# Patient Record
Sex: Male | Born: 1990 | Race: Black or African American | Hispanic: No | Marital: Single | Smoking: Current some day smoker
Health system: Southern US, Community
[De-identification: ages and names within clinical notes are randomized; demographics above are authoritative.]

## PROBLEM LIST (undated history)

## (undated) DIAGNOSIS — E663 Overweight: Secondary | ICD-10-CM

## (undated) DIAGNOSIS — I10 Essential (primary) hypertension: Secondary | ICD-10-CM

## (undated) HISTORY — DX: Overweight: E66.3

## (undated) HISTORY — DX: Essential (primary) hypertension: I10

---

## 2013-10-07 ENCOUNTER — Encounter (HOSPITAL_COMMUNITY): Payer: Self-pay | Admitting: Emergency Medicine

## 2013-10-07 ENCOUNTER — Emergency Department (HOSPITAL_COMMUNITY)
Admission: EM | Admit: 2013-10-07 | Discharge: 2013-10-07 | Disposition: A | Discharge status: Home / Self Care | Payer: BC Managed Care – PPO | Attending: Emergency Medicine | Admitting: Emergency Medicine

## 2013-10-07 DIAGNOSIS — Y92838 Other recreation area as the place of occurrence of the external cause: Secondary | ICD-10-CM

## 2013-10-07 DIAGNOSIS — T1490XA Injury, unspecified, initial encounter: Secondary | ICD-10-CM

## 2013-10-07 DIAGNOSIS — Y9239 Other specified sports and athletic area as the place of occurrence of the external cause: Secondary | ICD-10-CM | POA: Insufficient documentation

## 2013-10-07 DIAGNOSIS — W219XXA Striking against or struck by unspecified sports equipment, initial encounter: Secondary | ICD-10-CM | POA: Insufficient documentation

## 2013-10-07 DIAGNOSIS — F17200 Nicotine dependence, unspecified, uncomplicated: Secondary | ICD-10-CM | POA: Insufficient documentation

## 2013-10-07 DIAGNOSIS — S0180XA Unspecified open wound of other part of head, initial encounter: Secondary | ICD-10-CM | POA: Insufficient documentation

## 2013-10-07 DIAGNOSIS — Y9367 Activity, basketball: Secondary | ICD-10-CM | POA: Insufficient documentation

## 2013-10-07 DIAGNOSIS — S01111A Laceration without foreign body of right eyelid and periocular area, initial encounter: Secondary | ICD-10-CM

## 2013-10-07 DIAGNOSIS — S058X9A Other injuries of unspecified eye and orbit, initial encounter: Secondary | ICD-10-CM | POA: Insufficient documentation

## 2013-10-07 MED ORDER — LIDOCAINE-EPINEPHRINE-TETRACAINE (LET) SOLUTION
3.0000 mL | Freq: Once | NASAL | Status: AC
  Administered 2013-10-07: 3 mL via TOPICAL
  Filled 2013-10-07: qty 3

## 2013-10-07 NOTE — ED Provider Notes (Signed)
CSN: [id number]     Arrival date & time 10/07/13  1924 History   First MD Initiated Contact with Patient 10/07/13 2034     Chief Complaint  Patient presents with  . Facial Laceration     (Consider location/radiation/quality/duration/timing/severity/associated sxs/prior Treatment) HPI Pt is a 23yo male presenting to ED with laceration to his right eyelid that occurred due to being elbowed in the right eye during a basketball game just prior to arrival. Pain in right eyelid is burning, 2/10. Denies LOC. Denies change in vision. Denies headache, nausea or vomiting.  No pain medication taken PTA. Last tetanus was within last 5 years. Denies any other injuries.   History reviewed. No pertinent past medical history. History reviewed. No pertinent past surgical history. History reviewed. No pertinent family history. History  Substance Use Topics  . Smoking status: Current Every Day Smoker  . Smokeless tobacco: Not on file  . Alcohol Use: Yes    Review of Systems  Gastrointestinal: Negative for nausea and vomiting.  Skin: Positive for wound. Negative for color change.  Neurological: Negative for dizziness, seizures, syncope, light-headedness and headaches.  All other systems reviewed and are negative.     Allergies  Review of patient's allergies indicates no known allergies.  Home Medications   Prior to Admission medications   Not on File   BP 118/65  Pulse 70  Temp(Src) 98.2 F (36.8 C) (Oral)  Resp 16  SpO2 99% Physical Exam  Nursing note and vitals reviewed. Constitutional: He is oriented to person, place, and time. He appears well-developed and well-nourished.  HENT:  Head: Normocephalic. Head is with laceration.    Nose: Nose normal.  Mouth/Throat: Uvula is midline, oropharynx is clear and moist and mucous membranes are normal.  2cm laceration over lateral aspect of right eyelid. Minimal bleeding. Mild edema and tenderness. No bony tenderness or crepitus.   Eyes:  EOM are normal. Pupils are equal, round, and reactive to light.  Neck: Normal range of motion. Neck supple.  Cardiovascular: Normal rate.   Pulmonary/Chest: Effort normal.  Musculoskeletal: Normal range of motion.  Neurological: He is alert and oriented to person, place, and time. No cranial nerve deficit. Coordination normal.  Skin: Skin is warm and dry.  Psychiatric: He has a normal mood and affect. His behavior is normal.    ED Course  Procedures   LACERATION REPAIR Performed by: Junius Finner A. Authorized by: Ina Homes Consent: Verbal consent obtained. Risks and benefits: risks, benefits and alternatives were discussed Consent given by: patient Patient identity confirmed: provided demographic data Prepped and Draped in normal sterile fashion Wound explored  Laceration Location: right eyelid  Laceration Length: 2 cm  No Foreign Bodies seen or palpated  Anesthesia: topical and local infiltration  Local anesthetic: LET and lidocaine 1% with epinephrine  Anesthetic total: 0.25 ml  Irrigation method: syringe Amount of cleaning: standard  Skin closure: close, simple, 5-0 chromic gut  Number of sutures: 4  Technique: interrupted   Patient tolerance: Patient tolerated the procedure well with no immediate complications.   Labs Review Labs Reviewed - No data to display  Imaging Review No results found.   EKG Interpretation None      MDM   Final diagnoses:  Right eyelid laceration, initial encounter  Sports injury    Pt is a 23yo male presenting to ED with laceration to right eyelid.  Laceration repaired w/o immediate complication.  No LOC. No nausea vomiting or focal neuro deficits.  Will discharge  home [facility]-7 days. Return precautions provided. Pt verbalized understanding and agreement with tx plan.     Junius Finnerrin O'Malley, PA-C 10/07/13 2221

## 2013-10-07 NOTE — ED Provider Notes (Signed)
Medical screening examination/treatment/procedure(s) were performed by non-physician practitioner and as supervising physician I was immediately available for consultation/collaboration.   EKG Interpretation None        Rolan BuccoMelanie Emran Molzahn, MD 10/07/13 2306
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
## 2013-11-07 ENCOUNTER — Emergency Department (HOSPITAL_COMMUNITY): Payer: BC Managed Care – PPO

## 2013-11-07 ENCOUNTER — Encounter (HOSPITAL_COMMUNITY): Payer: Self-pay | Admitting: Emergency Medicine

## 2013-11-07 ENCOUNTER — Emergency Department (HOSPITAL_COMMUNITY)
Admission: EM | Admit: 2013-11-07 | Discharge: 2013-11-07 | Disposition: A | Discharge status: Home / Self Care | Payer: BC Managed Care – PPO | Attending: Emergency Medicine | Admitting: Emergency Medicine

## 2013-11-07 DIAGNOSIS — S01501A Unspecified open wound of lip, initial encounter: Secondary | ICD-10-CM | POA: Diagnosis present

## 2013-11-07 DIAGNOSIS — IMO0002 Reserved for concepts with insufficient information to code with codable children: Secondary | ICD-10-CM | POA: Insufficient documentation

## 2013-11-07 DIAGNOSIS — S01409A Unspecified open wound of unspecified cheek and temporomandibular area, initial encounter: Secondary | ICD-10-CM | POA: Insufficient documentation

## 2013-11-07 DIAGNOSIS — Y9289 Other specified places as the place of occurrence of the external cause: Secondary | ICD-10-CM | POA: Insufficient documentation

## 2013-11-07 DIAGNOSIS — S01502A Unspecified open wound of oral cavity, initial encounter: Secondary | ICD-10-CM | POA: Diagnosis not present

## 2013-11-07 DIAGNOSIS — Y9389 Activity, other specified: Secondary | ICD-10-CM | POA: Insufficient documentation

## 2013-11-07 DIAGNOSIS — F17200 Nicotine dependence, unspecified, uncomplicated: Secondary | ICD-10-CM | POA: Insufficient documentation

## 2013-11-07 DIAGNOSIS — S0181XA Laceration without foreign body of other part of head, initial encounter: Secondary | ICD-10-CM

## 2013-11-07 DIAGNOSIS — Z79899 Other long term (current) drug therapy: Secondary | ICD-10-CM | POA: Diagnosis not present

## 2013-11-07 DIAGNOSIS — S01512A Laceration without foreign body of oral cavity, initial encounter: Secondary | ICD-10-CM

## 2013-11-07 MED ORDER — LIDOCAINE-EPINEPHRINE 2 %-1:100000 IJ SOLN
20.0000 mL | Freq: Once | INTRAMUSCULAR | Status: AC
  Administered 2013-11-07: 1 mL
  Filled 2013-11-07: qty 1

## 2013-11-07 MED ORDER — HYDROCODONE-ACETAMINOPHEN 5-325 MG PO TABS
1.0000 | ORAL_TABLET | Freq: Once | ORAL | Status: AC
  Administered 2013-11-07: 1 via ORAL
  Filled 2013-11-07: qty 1

## 2013-11-07 MED ORDER — IBUPROFEN 200 MG PO TABS
600.0000 mg | ORAL_TABLET | Freq: Once | ORAL | Status: AC
  Administered 2013-11-07: 600 mg via ORAL
  Filled 2013-11-07: qty 3

## 2013-11-07 MED ORDER — CEPHALEXIN 250 MG PO CAPS
250.0000 mg | ORAL_CAPSULE | Freq: Once | ORAL | Status: AC
  Administered 2013-11-07: 250 mg via ORAL
  Filled 2013-11-07: qty 1

## 2013-11-07 MED ORDER — HYDROCODONE-ACETAMINOPHEN 5-325 MG PO TABS: 1.0000 | ORAL_TABLET | Freq: Four times a day (QID) | ORAL | Status: DC | PRN

## 2013-11-07 MED ORDER — CEPHALEXIN 250 MG PO CAPS: 250.0000 mg | ORAL_CAPSULE | Freq: Four times a day (QID) | ORAL | Status: DC

## 2013-11-07 NOTE — Discharge Instructions (Signed)
Absorbable Suture Repair °Absorbable sutures (stitches) hold skin together so you can heal. Keep skin wounds clean and dry for the next 2 to 3 days. Then, you may gently wash your wound and dress it with an antibiotic ointment as recommended. As your wound begins to heal, the sutures are no longer needed, and they typically begin to fall off. This will take 7 to 10 days. After 10 days, if your sutures are loose, you can remove them by wiping with a clean gauze pad or a cotton ball. Do not pull your sutures out. They should wipe away easily. If after 10 days they do not easily wipe away, have your caregiver take them out. Absorbable sutures may be used deep in a wound to help hold it together. If these stitches are below the skin, the body will absorb them completely in 3 to 4 weeks.  °You may need a tetanus shot if: °· You cannot remember when you had your last tetanus shot. °· You have never had a tetanus shot. °If you get a tetanus shot, your arm may swell, get red, and feel warm to the touch. This is common and not a problem. If you need a tetanus shot and you choose not to have one, there is a rare chance of getting tetanus. Sickness from tetanus can be serious. °SEEK IMMEDIATE MEDICAL CARE IF: °· You have redness in the wound area. °· The wound area feels hot to the touch. °· You develop swelling in the wound area. °· You develop pain. °· There is fluid drainage from the wound. °Document Released: 03/12/2004 Document Revised: 04/27/2011 Document Reviewed: 06/24/2010 °[employer]® Patient Information ©2015 [employer]. This information is not intended to replace advice given to you by your health care provider. Make sure you discuss any questions you have with your health care provider. ° °Facial Laceration ° A facial laceration is a cut on the face. These injuries can be painful and cause bleeding. Lacerations usually heal quickly, but they need special care to reduce scarring. °DIAGNOSIS  °Your health care  provider will take a medical history, ask for details about how the injury occurred, and examine the wound to determine how deep the cut is. °TREATMENT  °Some facial lacerations may not require closure. Others may not be able to be closed because of an increased risk of infection. The risk of infection and the chance for successful closure will depend on various factors, including the amount of time since the injury occurred. °The wound may be cleaned to help prevent infection. If closure is appropriate, pain medicines may be given if needed. Your health care provider will use stitches (sutures), wound glue (adhesive), or skin adhesive strips to repair the laceration. These tools bring the skin edges together to allow for faster healing and a better cosmetic outcome. If needed, you may also be given a tetanus shot. °HOME CARE INSTRUCTIONS °· Only take over-the-counter or prescription medicines as directed by your health care provider. °· Follow your health care provider's instructions for wound care. These instructions will vary depending on the technique used for closing the wound. °For Sutures: °· Keep the wound clean and dry.   °· If you were given a bandage (dressing), you should change it at least once a day. Also change the dressing if it becomes wet or dirty, or as directed by your health care provider.   °· Wash the wound with soap and water 2 times a day. Rinse the wound off with water to remove all   soap. Pat the wound dry with a clean towel.   After cleaning, apply a thin layer of the antibiotic ointment recommended by your health care provider. This will help prevent infection and keep the dressing from sticking.   You may shower as usual after the first 24 hours. Do not soak the wound in water until the sutures are removed.   Get your sutures removed as directed by your health care provider. With facial lacerations, sutures should usually be taken out after 4-5 days to avoid stitch marks.    Wait a few days after your sutures are removed before applying any makeup. For Skin Adhesive Strips:  Keep the wound clean and dry.   Do not get the skin adhesive strips wet. You may bathe carefully, using caution to keep the wound dry.   If the wound gets wet, pat it dry with a clean towel.   Skin adhesive strips will fall off on their own. You may trim the strips as the wound heals. Do not remove skin adhesive strips that are still stuck to the wound. They will fall off in time.  For Wound Adhesive:  You may briefly wet your wound in the shower or bath. Do not soak or scrub the wound. Do not swim. Avoid periods of heavy sweating until the skin adhesive has fallen off on its own. After showering or bathing, gently pat the wound dry with a clean towel.   Do not apply liquid medicine, cream medicine, ointment medicine, or makeup to your wound while the skin adhesive is in place. This may loosen the film before your wound is healed.   If a dressing is placed over the wound, be careful not to apply tape directly over the skin adhesive. This may cause the adhesive to be pulled off before the wound is healed.   Avoid prolonged exposure to sunlight or tanning lamps while the skin adhesive is in place.  The skin adhesive will usually remain in place for 5-10 days, then naturally fall off the skin. Do not pick at the adhesive film.  After Healing: Once the wound has healed, cover the wound with sunscreen during the day for 1 full year. This can help minimize scarring. Exposure to ultraviolet light in the first year will darken the scar. It can take 1-2 years for the scar to lose its redness and to heal completely.  SEEK IMMEDIATE MEDICAL CARE IF:  You have redness, pain, or swelling around the wound.   You see ayellowish-white fluid (pus) coming from the wound.   You have chills or a fever.  MAKE SURE YOU:  Understand these instructions.  Will watch your condition.  Will  get help right away if you are not doing well or get worse. Document Released: 03/12/2004 Document Revised: 11/23/2012 Document Reviewed: 09/15/2012 [facility] Patient Information 2015 [place], [place]. This information is not intended to replace advice given to you by your health care provider. Make sure you discuss any questions you have with your health care provider. The sutures in your mouth are absorbable and will NOT need removal but the 3 on your cheek will need to be removed in 5 days

## 2013-11-07 NOTE — ED Provider Notes (Signed)
Medical screening examination/treatment/procedure(s) were performed by non-physician practitioner and as supervising physician I was immediately available for consultation/collaboration.   EKG Interpretation None        Jahayra Mazo, MD 11/07/13 2313 
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
## 2013-11-07 NOTE — ED Notes (Signed)
Patient transported to X-ray 

## 2013-12-18 ENCOUNTER — Emergency Department (HOSPITAL_BASED_OUTPATIENT_CLINIC_OR_DEPARTMENT_OTHER)
Admission: EM | Admit: 2013-12-18 | Discharge: 2013-12-18 | Disposition: A | Discharge status: Home / Self Care | Payer: BC Managed Care – PPO | Attending: Emergency Medicine | Admitting: Emergency Medicine

## 2013-12-18 ENCOUNTER — Emergency Department (HOSPITAL_BASED_OUTPATIENT_CLINIC_OR_DEPARTMENT_OTHER): Payer: BC Managed Care – PPO

## 2013-12-18 ENCOUNTER — Encounter (HOSPITAL_BASED_OUTPATIENT_CLINIC_OR_DEPARTMENT_OTHER): Payer: Self-pay

## 2013-12-18 DIAGNOSIS — S01511A Laceration without foreign body of lip, initial encounter: Secondary | ICD-10-CM | POA: Diagnosis present

## 2013-12-18 DIAGNOSIS — S060X1A Concussion with loss of consciousness of 30 minutes or less, initial encounter: Secondary | ICD-10-CM | POA: Diagnosis not present

## 2013-12-18 DIAGNOSIS — Z720 Tobacco use: Secondary | ICD-10-CM | POA: Diagnosis not present

## 2013-12-18 MED ORDER — LIDOCAINE HCL (PF) 1 % IJ SOLN
5.0000 mL | Freq: Once | INTRAMUSCULAR | Status: DC
  Filled 2013-12-18: qty 5

## 2013-12-18 MED ORDER — AMOXICILLIN-POT CLAVULANATE 875-125 MG PO TABS: 1.0000 | ORAL_TABLET | Freq: Two times a day (BID) | ORAL | Status: DC

## 2013-12-18 MED ORDER — TRAMADOL HCL 50 MG PO TABS: 50.0000 mg | ORAL_TABLET | Freq: Four times a day (QID) | ORAL | Status: DC | PRN
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
## 2013-12-18 NOTE — Discharge Instructions (Signed)
Absorbable Suture Repair Absorbable sutures (stitches) hold skin together so you can heal. Keep skin wounds clean and dry for the next 2 to 3 days. Then, you may gently wash your wound and dress it with an antibiotic ointment as recommended. As your wound begins to heal, the sutures are no longer needed, and they typically begin to fall off. This will take 7 to 10 days. After 10 days, if your sutures are loose, you can remove them by wiping with a clean gauze pad or a cotton ball. Do not pull your sutures out. They should wipe away easily. If after 10 days they do not easily wipe away, have your caregiver take them out. Absorbable sutures may be used deep in a wound to help hold it together. If these stitches are below the skin, the body will absorb them completely in 3 to 4 weeks.  You may need a tetanus shot if:  You cannot remember when you had your last tetanus shot.  You have never had a tetanus shot. If you get a tetanus shot, your arm may swell, get red, and feel warm to the touch. This is common and not a problem. If you need a tetanus shot and you choose not to have one, there is a rare chance of getting tetanus. Sickness from tetanus can be serious. SEEK IMMEDIATE MEDICAL CARE IF:  You have redness in the wound area.  The wound area feels hot to the touch.  You develop swelling in the wound area.  You develop pain.  There is fluid drainage from the wound. Document Released: 03/12/2004 Document Revised: 04/27/2011 Document Reviewed: 06/24/2010 [facility] Patient Information 2015 [place], [place]. This information is not intended to replace advice given to you by your health care provider. Make sure you discuss any questions you have with your health care provider.  Concussion A concussion is a brain injury. It is caused by:  A hit to the head.  A quick and sudden movement (jolt) of the head or neck. A concussion is usually not life threatening. Even so, it can cause serious  problems. If you had a concussion before, you may have concussion-like problems after a hit to your head. HOME CARE General Instructions  Follow your doctor's directions carefully.  Take medicines only as told by your doctor.  Only take medicines your doctor says are safe.  Do not drink alcohol until your doctor says it is okay. Alcohol and some drugs can slow down healing. They can also put you at risk for further injury.  If you are having trouble remembering things, write them down.  Try to do one thing at a time if you get distracted easily. For example, do not watch TV while making dinner.  Talk to your family members or close friends when making important decisions.  Follow up with your doctor as told.  Watch your symptoms. Tell others to do the same. Serious problems can sometimes happen after a concussion. Older adults are more likely to have these problems.  Tell your teachers, school nurse, school counselor, coach, Event organiser, or work Production designer, theatre/television/film about your concussion. Tell them about what you can or cannot do. They should watch to see if:  It gets even harder for you to pay attention or concentrate.  It gets even harder for you to remember things or learn new things.  You need more time than normal to finish things.  You become annoyed (irritable) more than before.  You are not able to deal  with stress as well.  You have more problems than before.  Rest. Make sure you:  Get plenty of sleep at night.  Go to sleep early.  Go to bed at the same time every day. Try to wake up at the same time.  Rest during the day.  Take naps when you feel tired.  Limit activities where you have to think a lot or concentrate. These include:  Doing homework.  Doing work related to a job.  Watching TV.  Using the computer. Returning To Your Regular Activities Return to your normal activities slowly, not all at once. You must give your body and brain enough time to  heal.   Do not play sports or do other athletic activities until your doctor says it is okay.  Ask your doctor when you can drive, ride a bicycle, or work other vehicles or machines. Never do these things if you feel dizzy.  Ask your doctor about when you can return to work or school. Preventing Another Concussion It is very important to avoid another brain injury, especially before you have healed. In rare cases, another injury can lead to permanent brain damage, brain swelling, or death. The risk of this is greatest during the first 7-10 days after your injury. Avoid injuries by:   Wearing a seat belt when riding in a car.  Not drinking too much alcohol.  Avoiding activities that could lead to a second concussion (such as contact sports).  Wearing a helmet when doing activities like:  Biking.  Skiing.  Skateboarding.  Skating.  Making your home safer by:  Removing things from the floor or stairways that could make you trip.  Using grab bars in bathrooms and handrails by stairs.  Placing non-slip mats on floors and in bathtubs.  Improve lighting in dark areas. GET HELP IF:  It gets even harder for you to pay attention or concentrate.  It gets even harder for you to remember things or learn new things.  You need more time than normal to finish things.  You become annoyed (irritable) more than before.  You are not able to deal with stress as well.  You have more problems than before.  You have problems keeping your balance.  You are not able to react quickly when you should. Get help if you have any of these problems for more than 2 weeks:   Lasting (chronic) headaches.  Dizziness or trouble balancing.  Feeling sick to your stomach (nausea).  Seeing (vision) problems.  Being affected by noises or light more than normal.  Feeling sad, low, down in the dumps, blue, gloomy, or empty (depressed).  Mood changes (mood swings).  Feeling of fear or  nervousness about what may happen (anxiety).  Feeling annoyed.  Memory problems.  Problems concentrating or paying attention.  Sleep problems.  Feeling tired all the time. GET HELP RIGHT AWAY IF:   You have bad headaches or your headaches get worse.  You have weakness (even if it is in one hand, leg, or part of the face).  You have loss of feeling (numbness).  You feel off balance.  You keep throwing up (vomiting).  You feel tired.  One black center of your eye (pupil) is larger than the other.  You twitch or shake violently (convulse).  Your speech is not clear (slurred).  You are more confused, easily angered (agitated), or annoyed than before.  You have more trouble resting than before.  You are unable to recognize people  or places.  You have neck pain.  It is difficult to wake you up.  You have unusual behavior changes.  You pass out (lose consciousness). MAKE SURE YOU:   Understand these instructions.  Will watch your condition.  Will get help right away if you are not doing well or get worse. Document Released: 01/21/2009 Document Revised: 06/19/2013 Document Reviewed: 08/25/2012 [facility] Patient Information 2015 [place]. This information is not intended to replace advice given to you by your health care provider. Make sure you discuss any questions you have with your health care provider.  Facial Laceration A facial laceration is a cut on the face. These injuries can be painful and cause bleeding. Some cuts may need to be closed with stitches (sutures), skin adhesive strips, or wound glue. Cuts usually heal quickly but can leave a scar. It can take 1-2 years for the scar to go away completely. HOME CARE   Only take medicines as told by your doctor.  Follow your doctor's instructions for wound care. For Stitches:  Keep the cut clean and dry.  If you have a bandage (dressing), change it at least once a day. Change the bandage if it gets  wet or dirty, or as told by your doctor.  Wash the cut with soap and water 2 times a day. Rinse the cut with water. Pat it dry with a clean towel.  Put a thin layer of medicated cream on the cut as told by your doctor.  You may shower after the first 24 hours. Do not soak the cut in water until the stitches are removed.  Have your stitches removed as told by your doctor.  Do not wear any makeup until a few days after your stitches are removed. For Skin Adhesive Strips:  Keep the cut clean and dry.  Do not get the strips wet. You may take a bath, but be careful to keep the cut dry.  If the cut gets wet, pat it dry with a clean towel.  The strips will fall off on their own. Do not remove the strips that are still stuck to the cut. For Wound Glue:  You may shower or take baths. Do not soak or scrub the cut. Do not swim. Avoid heavy sweating until the glue falls off on its own. After a shower or bath, pat the cut dry with a clean towel.  Do not put medicine or makeup on your cut until the glue falls off.  If you have a bandage, do not put tape over the glue.  Avoid lots of sunlight or tanning lamps until the glue falls off.  The glue will fall off on its own in 5-10 days. Do not pick at the glue. After Healing: Put sunscreen on the cut for the first year to reduce your scar. GET HELP RIGHT AWAY IF:   Your cut area gets red, painful, or puffy (swollen).  You see a yellowish-white fluid (pus) coming from the cut.  You have chills or a fever. MAKE SURE YOU:   Understand these instructions.  Will watch your condition.  Will get help right away if you are not doing well or get worse. Document Released: 07/22/2007 Document Revised: 11/23/2012 Document Reviewed: 09/15/2012 [facility] Patient Information 2015 [place]. This information is not intended to replace advice given to you by your health care provider. Make sure you discuss any questions you have with your health  care provider.

## 2015-07-30 ENCOUNTER — Encounter (HOSPITAL_COMMUNITY): Payer: Self-pay

## 2015-07-30 ENCOUNTER — Emergency Department (HOSPITAL_COMMUNITY)
Admission: EM | Admit: 2015-07-30 | Discharge: 2015-07-30 | Disposition: A | Discharge status: Home / Self Care | Payer: Self-pay | Attending: Emergency Medicine | Admitting: Emergency Medicine

## 2015-07-30 DIAGNOSIS — K219 Gastro-esophageal reflux disease without esophagitis: Secondary | ICD-10-CM | POA: Insufficient documentation

## 2015-07-30 DIAGNOSIS — F17210 Nicotine dependence, cigarettes, uncomplicated: Secondary | ICD-10-CM | POA: Insufficient documentation

## 2015-07-30 MED ORDER — GI COCKTAIL ~~LOC~~
30.0000 mL | Freq: Once | ORAL | Status: AC
  Administered 2015-07-30: 30 mL via ORAL
  Filled 2015-07-30: qty 30

## 2015-07-30 MED ORDER — OMEPRAZOLE 20 MG PO CPDR: 20.0000 mg | DELAYED_RELEASE_CAPSULE | Freq: Two times a day (BID) | ORAL | Status: AC

## 2015-07-30 NOTE — Discharge Instructions (Signed)
Gastroesophageal Reflux Disease, Adult Normally, food travels down the esophagus and stays in the stomach to be digested. However, when a person has gastroesophageal reflux disease (GERD), food and stomach acid move back up into the esophagus. When this happens, the esophagus becomes sore and inflamed. Over time, GERD can create small holes (ulcers) in the lining of the esophagus.  CAUSES This condition is caused by a problem with the muscle between the esophagus and the stomach (lower esophageal sphincter, or LES). Normally, the LES muscle closes after food passes through the esophagus to the stomach. When the LES is weakened or abnormal, it does not close properly, and that allows food and stomach acid to go back up into the esophagus. The LES can be weakened by certain dietary substances, medicines, and medical conditions, including:  Tobacco use.  Pregnancy.  Having a hiatal hernia.  Heavy alcohol use.  Certain foods and beverages, such as coffee, chocolate, onions, and peppermint. RISK FACTORS This condition is more likely to develop in:  People who have an increased body weight.  People who have connective tissue disorders.  People who use NSAID medicines. SYMPTOMS Symptoms of this condition include:  Heartburn.  Difficult or painful swallowing.  The feeling of having a lump in the throat.  Abitter taste in the mouth.  Bad breath.  Having a large amount of saliva.  Having an upset or bloated stomach.  Belching.  Chest pain.  Shortness of breath or wheezing.  Ongoing (chronic) cough or a night-time cough.  Wearing away of tooth enamel.  Weight loss. Different conditions can cause chest pain. Make sure to see your health care provider if you experience chest pain. DIAGNOSIS Your health care provider will take a medical history and perform a physical exam. To determine if you have mild or severe GERD, your health care provider may also monitor how you respond  to treatment. You may also have other tests, including:  An endoscopy toexamine your stomach and esophagus with a small camera.  A test thatmeasures the acidity level in your esophagus.  A test thatmeasures how much pressure is on your esophagus.  A barium swallow or modified barium swallow to show the shape, size, and functioning of your esophagus. TREATMENT The goal of treatment is to help relieve your symptoms and to prevent complications. Treatment for this condition may vary depending on how severe your symptoms are. Your health care provider may recommend:  Changes to your diet.  Medicine.  Surgery. HOME CARE INSTRUCTIONS Diet  Follow a diet as recommended by your health care provider. This may involve avoiding foods and drinks such as:  Coffee and tea (with or without caffeine).  Drinks that containalcohol.  Energy drinks and sports drinks.  Carbonated drinks or sodas.  Chocolate and cocoa.  Peppermint and mint flavorings.  Garlic and onions.  Horseradish.  Spicy and acidic foods, including peppers, chili powder, curry powder, vinegar, hot sauces, and barbecue sauce.  Citrus fruit juices and citrus fruits, such as oranges, lemons, and limes.  Tomato-based foods, such as red sauce, chili, salsa, and pizza with red sauce.  Fried and fatty foods, such as donuts, french fries, potato chips, and high-fat dressings.  High-fat meats, such as hot dogs and fatty cuts of red and white meats, such as rib eye steak, sausage, ham, and bacon.  High-fat dairy items, such as whole milk, butter, and cream cheese.  Eat small, frequent meals instead of large meals.  Avoid drinking large amounts of liquid with your   meals.  Avoid eating meals during the 2-3 hours before bedtime.  Avoid lying down right after you eat.  Do not exercise right after you eat. General Instructions  Pay attention to any changes in your symptoms.  Take over-the-counter and prescription  medicines only as told by your health care provider. Do not take aspirin, ibuprofen, or other NSAIDs unless your health care provider told you to do so.  Do not use any tobacco products, including cigarettes, chewing tobacco, and e-cigarettes. If you need help quitting, ask your health care provider.  Wear loose-fitting clothing. Do not wear anything tight around your waist that causes pressure on your abdomen.  Raise (elevate) the head of your bed 6 inches (15cm).  Try to reduce your stress, such as with yoga or meditation. If you need help reducing stress, ask your health care provider.  If you are overweight, reduce your weight to an amount that is healthy for you. Ask your health care provider for guidance about a safe weight loss goal.  Keep all follow-up visits as told by your health care provider. This is important. SEEK MEDICAL CARE IF:  You have new symptoms.  You have unexplained weight loss.  You have difficulty swallowing, or it hurts to swallow.  You have wheezing or a persistent cough.  Your symptoms do not improve with treatment.  You have a hoarse voice. SEEK IMMEDIATE MEDICAL CARE IF:  You have pain in your arms, neck, jaw, teeth, or back.  You feel sweaty, dizzy, or light-headed.  You have chest pain or shortness of breath.  You vomit and your vomit looks like blood or coffee grounds.  You faint.  Your stool is bloody or black.  You cannot swallow, drink, or eat.   This information is not intended to replace advice given to you by your health care provider. Make sure you discuss any questions you have with your health care provider.   Document Released: 11/12/2004 Document Revised: 10/24/2014 Document Reviewed: 05/30/2014 Elsevier Interactive Patient Education 2016 [employer] Food Choices for Gastroesophageal Reflux Disease, Adult When you have gastroesophageal reflux disease (GERD), the foods you eat and your eating habits are very important.  Choosing the right foods can help ease the discomfort of GERD. WHAT GENERAL GUIDELINES DO I NEED TO FOLLOW?  Choose fruits, vegetables, whole grains, low-fat dairy products, and low-fat meat, fish, and poultry.  Limit fats such as oils, salad dressings, butter, nuts, and avocado.  Keep a food diary to identify foods that cause symptoms.  Avoid foods that cause reflux. These may be different for different people.  Eat frequent small meals instead of three large meals each day.  Eat your meals slowly, in a relaxed setting.  Limit fried foods.  Cook foods using methods other than frying.  Avoid drinking alcohol.  Avoid drinking large amounts of liquids with your meals.  Avoid bending over or lying down until 2-3 hours after eating. WHAT FOODS ARE NOT RECOMMENDED? The following are some foods and drinks that may worsen your symptoms: Vegetables Tomatoes. Tomato juice. Tomato and spaghetti sauce. Chili peppers. Onion and garlic. Horseradish. Fruits Oranges, grapefruit, and lemon (fruit and juice). Meats High-fat meats, fish, and poultry. This includes hot dogs, ribs, ham, sausage, salami, and bacon. Dairy Whole milk and chocolate milk. Sour cream. Cream. Butter. Ice cream. Cream cheese.  Beverages Coffee and tea, with or without caffeine. Carbonated beverages or energy drinks. Condiments Hot sauce. Barbecue sauce.  Sweets/Desserts Chocolate and cocoa. Donuts. Peppermint and spearmint.   Fats and Oils High-fat foods, including French fries and potato chips. Other Vinegar. Strong spices, such as black pepper, white pepper, red pepper, cayenne, curry powder, cloves, ginger, and chili powder. The items listed above may not be a complete list of foods and beverages to avoid. Contact your dietitian for more information.   This information is not intended to replace advice given to you by your health care provider. Make sure you discuss any questions you have with your health care  provider.   Document Released: 02/02/2005 Document Revised: 02/23/2014 Document Reviewed: 12/07/2012 Elsevier Interactive Patient Education 2016 [employer] Inc[employer]  

## 2015-07-30 NOTE — ED Provider Notes (Signed)
CSN: [id number]     Arrival date & time 07/30/15  1210 History   First MD Initiated Contact with Patient 07/30/15 1537     Chief Complaint  Patient presents with  . Throat swelling    HPI Pt has had a sensation that something is stuck in his throat the past week however he has not had any trouble swallowing or eating.  no trouble breathing.  He has an acid feeling in his stomach.  Feels like he needs to burp.  No vomiting.  No fevers.  No pain.  Still able to eat and drink.  Did notice it got worse after drinking lemon aide.  Has not tried any medications. History reviewed. No pertinent past medical history. History reviewed. No pertinent past surgical history. No family history on file. Social History  Substance Use Topics  . Smoking status: Current Every Day Smoker    Types: Cigars  . Smokeless tobacco: None  . Alcohol Use: Yes     Comment: socially     Review of Systems  All other systems reviewed and are negative.     Allergies  Review of patient's allergies indicates no known allergies.  Home Medications   Prior to Admission medications   Medication Sig Start Date End Date Taking? Authorizing Provider  amoxicillin-clavulanate (AUGMENTIN) 875-125 MG per tablet Take 1 tablet by mouth every 12 (twelve) hours. 12/18/13   Gilda Crease, MD  omeprazole (PRILOSEC) 20 MG capsule Take 1 capsule (20 mg total) by mouth 2 (two) times daily before a meal. 07/30/15   Linwood Dibbles, MD  traMADol (ULTRAM) 50 MG tablet Take 1 tablet (50 mg total) by mouth every 6 (six) hours as needed. 12/18/13   Gilda Crease, MD   BP 149/80 mmHg  Pulse 46  Temp(Src) 98.6 F (37 C) (Oral)  Resp 18  Ht  (1.702 m)  Wt 83.915 kg  BMI 28.97 kg/m2  SpO2 100% Physical Exam  Constitutional: He appears well-developed and well-nourished. No distress.  HENT:  Head: Normocephalic and atraumatic.  Right Ear: External ear normal.  Left Ear: External ear normal.  Mouth/Throat: Oropharynx is  clear and moist and mucous membranes are normal. No oral lesions. No oropharyngeal exudate, posterior oropharyngeal edema, posterior oropharyngeal erythema or tonsillar abscesses.  Eyes: Conjunctivae are normal. Right eye exhibits no discharge. Left eye exhibits no discharge. No scleral icterus.  Neck: Neck supple. No tracheal deviation present.  Cardiovascular: Normal rate, regular rhythm and intact distal pulses.   Pulmonary/Chest: Effort normal and breath sounds normal. No stridor. No respiratory distress. He has no wheezes. He has no rales.  Abdominal: Soft. Bowel sounds are normal. He exhibits no distension. There is no tenderness. There is no rebound and no guarding.  Musculoskeletal: He exhibits no edema or tenderness.  Neurological: He is alert. He has normal strength. No cranial nerve deficit (no facial droop, extraocular movements intact, no slurred speech) or sensory deficit. He exhibits normal muscle tone. He displays no seizure activity. Coordination normal.  Skin: Skin is warm and dry. No rash noted.  Psychiatric: He has a normal mood and affect.  Nursing note and vitals reviewed.   ED Course  Procedures (including critical care time)   MDM   Final diagnoses:  Gastroesophageal reflux disease, esophagitis presence not specified    Sx suggestive of GERD, esophagitis.  Appears well hydrated.  Tolerating fluids and food.  No obstruction.  Doubt mass. Will dc home with rx for prilosec.  Supplement with  maalox or tums.  Avoid caffeine, alcohol, smoking.  Follow up with a PCP in 1 week.   Discussed gi referral if not getting better after treatment.    Linwood DibblesJon Shawnee Gambone, MD 07/30/15 [phone]

## 2015-07-30 NOTE — ED Notes (Addendum)
Patient complains of feeling like something stuck in throat the past week. States ongoing heartburn with same. No soreness to throat. No redness, no swelling to throat

## 2015-12-26 ENCOUNTER — Encounter (HOSPITAL_COMMUNITY): Payer: Self-pay | Admitting: Emergency Medicine

## 2015-12-26 ENCOUNTER — Emergency Department (HOSPITAL_COMMUNITY)
Admission: EM | Admit: 2015-12-26 | Discharge: 2015-12-27 | Disposition: A | Discharge status: Home / Self Care | Payer: No Typology Code available for payment source | Attending: Emergency Medicine | Admitting: Emergency Medicine

## 2015-12-26 DIAGNOSIS — F17290 Nicotine dependence, other tobacco product, uncomplicated: Secondary | ICD-10-CM | POA: Diagnosis not present

## 2015-12-26 DIAGNOSIS — Y999 Unspecified external cause status: Secondary | ICD-10-CM | POA: Insufficient documentation

## 2015-12-26 DIAGNOSIS — Y92410 Unspecified street and highway as the place of occurrence of the external cause: Secondary | ICD-10-CM | POA: Diagnosis not present

## 2015-12-26 DIAGNOSIS — S50811A Abrasion of right forearm, initial encounter: Secondary | ICD-10-CM | POA: Insufficient documentation

## 2015-12-26 DIAGNOSIS — S59911A Unspecified injury of right forearm, initial encounter: Secondary | ICD-10-CM | POA: Diagnosis present

## 2015-12-26 DIAGNOSIS — Z79899 Other long term (current) drug therapy: Secondary | ICD-10-CM | POA: Insufficient documentation

## 2015-12-26 DIAGNOSIS — Y9389 Activity, other specified: Secondary | ICD-10-CM | POA: Diagnosis not present
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
## 2015-12-27 MED ORDER — METHOCARBAMOL 500 MG PO TABS: 500.0000 mg | ORAL_TABLET | Freq: Two times a day (BID) | ORAL | 0 refills | Status: AC

## 2015-12-27 MED ORDER — IBUPROFEN 800 MG PO TABS: 800.0000 mg | ORAL_TABLET | Freq: Three times a day (TID) | ORAL | 0 refills | Status: DC
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
## 2015-12-27 NOTE — ED Notes (Signed)
Patient d/c'd self care.  F/U and medications reviewed.  Patient verbalized understanding. 

## 2015-12-27 NOTE — Discharge Instructions (Signed)
You likely will be sore over the next 2-3 days. Please take the Robaxin (muscle relaxer) twice daily, and ibuprofen every six hours for relief of your pain and stiffness. Robaxin can make you drowsy, so take before going to sleep, and do not drive while taking it. Also, please come back to the ED for any concerning neurologic symptoms or symptoms of concussion as we discussed.

## 2015-12-29 ENCOUNTER — Emergency Department (HOSPITAL_COMMUNITY): Payer: No Typology Code available for payment source

## 2015-12-29 ENCOUNTER — Encounter (HOSPITAL_COMMUNITY): Payer: Self-pay | Admitting: Emergency Medicine

## 2015-12-29 ENCOUNTER — Emergency Department (HOSPITAL_COMMUNITY)
Admission: EM | Admit: 2015-12-29 | Discharge: 2015-12-29 | Disposition: A | Discharge status: Home / Self Care | Payer: No Typology Code available for payment source | Attending: Emergency Medicine | Admitting: Emergency Medicine

## 2015-12-29 ENCOUNTER — Emergency Department (HOSPITAL_COMMUNITY)
Admission: EM | Admit: 2015-12-29 | Discharge: 2015-12-29 | Disposition: A | Discharge status: Home / Self Care | Payer: No Typology Code available for payment source | Source: Home / Self Care | Attending: Emergency Medicine | Admitting: Emergency Medicine

## 2015-12-29 DIAGNOSIS — Y92410 Unspecified street and highway as the place of occurrence of the external cause: Secondary | ICD-10-CM | POA: Insufficient documentation

## 2015-12-29 DIAGNOSIS — F17290 Nicotine dependence, other tobacco product, uncomplicated: Secondary | ICD-10-CM

## 2015-12-29 DIAGNOSIS — Y999 Unspecified external cause status: Secondary | ICD-10-CM | POA: Insufficient documentation

## 2015-12-29 DIAGNOSIS — S39012A Strain of muscle, fascia and tendon of lower back, initial encounter: Secondary | ICD-10-CM

## 2015-12-29 DIAGNOSIS — Y939 Activity, unspecified: Secondary | ICD-10-CM

## 2015-12-29 DIAGNOSIS — S3992XA Unspecified injury of lower back, initial encounter: Secondary | ICD-10-CM | POA: Diagnosis not present

## 2015-12-29 DIAGNOSIS — M542 Cervicalgia: Secondary | ICD-10-CM | POA: Insufficient documentation

## 2015-12-29 MED ORDER — HYDROCODONE-ACETAMINOPHEN 5-325 MG PO TABS: 1.0000 | ORAL_TABLET | ORAL | 0 refills | Status: DC | PRN

## 2015-12-29 MED ORDER — OXYCODONE-ACETAMINOPHEN 5-325 MG PO TABS
1.0000 | ORAL_TABLET | Freq: Once | ORAL | Status: AC
  Administered 2015-12-29: 1 via ORAL
  Filled 2015-12-29: qty 1
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
## 2015-12-29 NOTE — ED Notes (Signed)
Patient transported to X-ray 

## 2015-12-29 NOTE — Discharge Instructions (Signed)
Take the prescribed medication as directed.  Do not drive while taking this.  May continue taking the other medications with this as well. Follow-up with your primary care doctor. Return to the ED for new or worsening symptoms.
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
## 2015-12-29 NOTE — ED Triage Notes (Signed)
Pt states [facility] and requested a head CT.  States he was told to return if he continued to have head pain.  Pt states head pain and back pain continued and [facility] and [facility] head CT and received pain medication.  Pt states he requested a work note until Wednesday and was told that he didn't need one for that long but [facility].  States on Friday they gave him a work note through Monday.

## 2019-10-17 ENCOUNTER — Ambulatory Visit
Admission: EM | Admit: 2019-10-17 | Discharge: 2019-10-17 | Disposition: A | Discharge status: Home / Self Care | Payer: Self-pay | Attending: Emergency Medicine | Admitting: Emergency Medicine

## 2019-10-17 ENCOUNTER — Other Ambulatory Visit: Payer: Self-pay

## 2019-10-17 ENCOUNTER — Encounter: Payer: Self-pay | Admitting: Emergency Medicine

## 2019-10-17 DIAGNOSIS — Z1152 Encounter for screening for COVID-19: Secondary | ICD-10-CM

## 2019-10-17 DIAGNOSIS — J069 Acute upper respiratory infection, unspecified: Secondary | ICD-10-CM

## 2019-10-17 MED ORDER — BENZONATATE 200 MG PO CAPS: 200.0000 mg | ORAL_CAPSULE | Freq: Three times a day (TID) | ORAL | 0 refills | Status: AC | PRN

## 2019-10-17 MED ORDER — IBUPROFEN 800 MG PO TABS: 800.0000 mg | ORAL_TABLET | Freq: Three times a day (TID) | ORAL | 0 refills | Status: AC

## 2019-10-17 MED ORDER — FLUTICASONE PROPIONATE 50 MCG/ACT NA SUSP: 1.0000 | Freq: Every day | NASAL | 2 refills | Status: AC

## 2019-10-17 NOTE — Discharge Instructions (Signed)
Covid test pending, monitor my chart for results Ibuprofen and Tylenol for body aches, headaches and fevers Tessalon/benzonatate every 8 hours as needed for cough Flonase nasal spray 1 to 2 spray in each nostril daily Rest and fluids Follow-up if not improving or worsening

## 2019-10-17 NOTE — ED Provider Notes (Signed)
[facility]    CSN: [id number] Arrival date & time: 10/17/19  1112      History   Chief Complaint Chief Complaint  Patient presents with  . Nasal Congestion  . Cough    HPI Jesus Wong is a 29 y.o. male presenting today for evaluation of URI symptoms.  Patient has had cough congestion and sore throat along with fatigue and slight diarrhea for approximately 3 days.  He denies any fevers.  Denies any chest pain or shortness of breath.  Reports possible Covid exposure through his other his son as he was exposed at school.  His son has had cold symptoms, but has had testing which was negative.  HPI  History reviewed. No pertinent past medical history.  There are no problems to display for this patient.   History reviewed. No pertinent surgical history.     Home Medications    Prior to Admission medications   Medication Sig Start Date End Date Taking? Authorizing Provider  benzonatate (TESSALON) 200 MG capsule Take 1 capsule (200 mg total) by mouth 3 (three) times daily as needed for up to 7 days for cough. 10/17/19 10/24/19  Moreen Piggott C, PA-C  fluticasone (FLONASE) 50 MCG/ACT nasal spray Place 1-2 sprays into both nostrils daily. 10/17/19   Nneoma Harral C, PA-C  ibuprofen (ADVIL) 800 MG tablet Take 1 tablet (800 mg total) by mouth 3 (three) times daily. 10/17/19   Venicia Vandall C, PA-C  methocarbamol (ROBAXIN) 500 MG tablet Take 1 tablet (500 mg total) by mouth 2 (two) times daily. 12/27/15   de Villier, Daryl F II, PA  omeprazole (PRILOSEC) 20 MG capsule Take 1 capsule (20 mg total) by mouth 2 (two) times daily before a meal. 07/30/15   Linwood Dibbles, MD    Family History History reviewed. No pertinent family history.  Social History Social History   Tobacco Use  . Smoking status: Current Some Day Smoker    Types: Cigars  . Smokeless tobacco: Never Used  Substance Use Topics  . Alcohol use: Yes    Comment: socially   . Drug use: No      Allergies   Patient has no known allergies.   Review of Systems Review of Systems  Constitutional: Positive for fatigue. Negative for activity change, appetite change, chills and fever.  HENT: Positive for congestion and sore throat. Negative for ear pain, rhinorrhea, sinus pressure and trouble swallowing.   Eyes: Negative for discharge and redness.  Respiratory: Positive for cough. Negative for chest tightness and shortness of breath.   Cardiovascular: Negative for chest pain.  Gastrointestinal: Negative for abdominal pain, diarrhea, nausea and vomiting.  Musculoskeletal: Negative for myalgias.  Skin: Negative for rash.  Neurological: Negative for dizziness, light-headedness and headaches.     Physical Exam Triage Vital Signs ED Triage Vitals  Enc Vitals Group     BP      Pulse      Resp      Temp      Temp src      SpO2      Weight      Height      Head Circumference      Peak Flow      Pain Score      Pain Loc      Pain Edu?      Excl. in GC?    No data found.  Updated Vital Signs BP 105/66 (BP Location: Left Arm)   Pulse 69  Temp 99 F (37.2 C) (Oral)   Resp 18   SpO2 98%   Visual Acuity Right Eye Distance:   Left Eye Distance:   Bilateral Distance:    Right Eye Near:   Left Eye Near:    Bilateral Near:     Physical Exam Vitals and nursing note reviewed.  Constitutional:      Appearance: He is well-developed.     Comments: No acute distress  HENT:     Head: Normocephalic and atraumatic.     Ears:     Comments: Bilateral ears without tenderness to palpation of external auricle, tragus and mastoid, EAC's without erythema or swelling, TM's with good bony landmarks and cone of light. Non erythematous.     Nose: Nose normal.     Mouth/Throat:     Comments: Oral mucosa pink and moist, no tonsillar enlargement or exudate. Posterior pharynx patent and nonerythematous, no uvula deviation or swelling. Normal phonation. Eyes:      Conjunctiva/sclera: Conjunctivae normal.  Cardiovascular:     Rate and Rhythm: Normal rate.  Pulmonary:     Effort: Pulmonary effort is normal. No respiratory distress.     Comments: Breathing comfortably at rest, CTABL, no wheezing, rales or other adventitious sounds auscultated Abdominal:     General: There is no distension.  Musculoskeletal:        General: Normal range of motion.     Cervical back: Neck supple.  Skin:    General: Skin is warm and dry.  Neurological:     Mental Status: He is alert and oriented to person, place, and time.      UC Treatments / Results  Labs (all labs ordered are listed, but only abnormal results are displayed) Labs Reviewed  NOVEL CORONAVIRUS, NAA    EKG   Radiology No results found.  Procedures Procedures (including critical care time)  Medications Ordered in UC Medications - No data to display  Initial Impression / Assessment and Plan / UC Course  I have reviewed the triage vital signs and the nursing notes.  Pertinent labs & imaging results that were available during my care of the patient were reviewed by me and considered in my medical decision making (see chart for details).     Covid test pending, suspect likely viral etiology and recommend symptomatic and supportive care, rest and fluids. Discussed strict return precautions. Patient verbalized understanding and is agreeable with plan.  Final Clinical Impressions(s) / UC Diagnoses   Final diagnoses:  Encounter for screening for COVID-19  Viral URI with cough     Discharge Instructions     Covid test pending, monitor my chart for results Ibuprofen and Tylenol for body aches, headaches and fevers Tessalon/benzonatate every 8 hours as needed for cough Flonase nasal spray 1 to 2 spray in each nostril daily Rest and fluids Follow-up if not improving or worsening    ED Prescriptions    Medication Sig Dispense Auth. Provider   fluticasone (FLONASE) 50 MCG/ACT  nasal spray Place 1-2 sprays into both nostrils daily. 16 g Satin Boal C, PA-C   ibuprofen (ADVIL) 800 MG tablet Take 1 tablet (800 mg total) by mouth 3 (three) times daily. 21 tablet Jocelynne Duquette C, PA-C   benzonatate (TESSALON) 200 MG capsule Take 1 capsule (200 mg total) by mouth 3 (three) times daily as needed for up to 7 days for cough. 28 capsule Annasofia Pohl, [place] C, PA-C     PDMP not reviewed this encounter.   Naomie Crow, [employer]  C, PA-C 10/17/19 1228

## 2019-10-17 NOTE — ED Triage Notes (Signed)
Pt here for cough and nasal congestion x 3 days 

## 2019-10-18 LAB — NOVEL CORONAVIRUS, NAA: SARS-CoV-2, NAA: NOT DETECTED

## 2020-01-17 DIAGNOSIS — U071 COVID-19: Secondary | ICD-10-CM

## 2020-01-17 HISTORY — DX: COVID-19: U07.1

## 2020-01-28 ENCOUNTER — Emergency Department (HOSPITAL_COMMUNITY)
Admission: EM | Admit: 2020-01-28 | Discharge: 2020-01-29 | Disposition: A | Discharge status: Home / Self Care | Payer: HRSA Program | Attending: Emergency Medicine | Admitting: Emergency Medicine

## 2020-01-28 ENCOUNTER — Emergency Department (HOSPITAL_COMMUNITY): Payer: HRSA Program

## 2020-01-28 ENCOUNTER — Encounter (HOSPITAL_COMMUNITY): Payer: Self-pay | Admitting: *Deleted

## 2020-01-28 ENCOUNTER — Other Ambulatory Visit: Payer: Self-pay

## 2020-01-28 DIAGNOSIS — R42 Dizziness and giddiness: Secondary | ICD-10-CM | POA: Diagnosis not present

## 2020-01-28 DIAGNOSIS — U071 COVID-19: Secondary | ICD-10-CM | POA: Diagnosis not present

## 2020-01-28 DIAGNOSIS — R519 Headache, unspecified: Secondary | ICD-10-CM | POA: Diagnosis present

## 2020-01-28 DIAGNOSIS — I10 Essential (primary) hypertension: Secondary | ICD-10-CM | POA: Diagnosis not present

## 2020-01-28 DIAGNOSIS — F17290 Nicotine dependence, other tobacco product, uncomplicated: Secondary | ICD-10-CM | POA: Diagnosis not present

## 2020-01-28 LAB — CBC WITH DIFFERENTIAL/PLATELET
Abs Immature Granulocytes: 0 10*3/uL (ref 0.00–0.07)
Basophils Absolute: 0 10*3/uL (ref 0.0–0.1)
Basophils Relative: 0 %
Eosinophils Absolute: 0 10*3/uL (ref 0.0–0.5)
Eosinophils Relative: 0 %
HCT: 41.6 % (ref 39.0–52.0)
Hemoglobin: 14.5 g/dL (ref 13.0–17.0)
Immature Granulocytes: 0 %
Lymphocytes Relative: 30 %
Lymphs Abs: 1 10*3/uL (ref 0.7–4.0)
MCH: 30.1 pg (ref 26.0–34.0)
MCHC: 34.9 g/dL (ref 30.0–36.0)
MCV: 86.5 fL (ref 80.0–100.0)
Monocytes Absolute: 0.4 10*3/uL (ref 0.1–1.0)
Monocytes Relative: 12 %
Neutro Abs: 2 10*3/uL (ref 1.7–7.7)
Neutrophils Relative %: 58 %
Platelets: 168 10*3/uL (ref 150–400)
RBC: 4.81 MIL/uL (ref 4.22–5.81)
RDW: 11.7 % (ref 11.5–15.5)
WBC: 3.4 10*3/uL — ABNORMAL LOW (ref 4.0–10.5)
nRBC: 0 % (ref 0.0–0.2)

## 2020-01-28 LAB — COMPREHENSIVE METABOLIC PANEL
ALT: 20 U/L (ref 0–44)
AST: 26 U/L (ref 15–41)
Albumin: 4.1 g/dL (ref 3.5–5.0)
Alkaline Phosphatase: 36 U/L — ABNORMAL LOW (ref 38–126)
Anion gap: 11 (ref 5–15)
BUN: 11 mg/dL (ref 6–20)
CO2: 26 mmol/L (ref 22–32)
Calcium: 9.1 mg/dL (ref 8.9–10.3)
Chloride: 101 mmol/L (ref 98–111)
Creatinine, Ser: 1.01 mg/dL (ref 0.61–1.24)
GFR, Estimated: >60 mL/min (ref >60)
Glucose, Bld: 84 mg/dL (ref 70–99)
Potassium: 3.9 mmol/L (ref 3.5–5.1)
Sodium: 138 mmol/L (ref 135–145)
Total Bilirubin: 0.6 mg/dL (ref 0.3–1.2)
Total Protein: 7.2 g/dL (ref 6.5–8.1)

## 2020-01-28 MED ORDER — LACTATED RINGERS IV BOLUS
1000.0000 mL | Freq: Once | INTRAVENOUS | Status: AC
  Administered 2020-01-28: 22:00:00 1000 mL via INTRAVENOUS

## 2020-01-28 NOTE — ED Triage Notes (Signed)
The pt is here becaUSE HE TOOK 2 COVID TESTS AT HOME AND THEY WERE positive  He has had numerus complaints since Thursday headache weakness  He fell and struck his head 2 days ago from weakness ?? No temp alert no distress  He has had body aches

## 2020-01-28 NOTE — ED Notes (Signed)
Pt to CT

## 2020-01-28 NOTE — ED Provider Notes (Signed)
[facility] EMERGENCY DEPARTMENT Provider Note   CSN: [id number] Arrival date & time: 01/28/20  1647     History Chief Complaint  Patient presents with  . Covid Positive    Jesus Wong is a 29 y.o. male with a past medical history of hypertension who presents today for evaluation of 3 days of COVID-19 symptoms.  He took 2 Covid tests at home and they were both positive.  He states that he has had multiple complaints including myalgias, arthralgias, headaches, and a syncopal event.  He states that yesterday he sat in the tub for an extended period of time to try and help with his knee pain.  He then stood up to take a hot shower and about 5 minutes after that felt lightheaded and had a syncopal event striking his right anterior forehead on the rim of the tub.  He states that since then he has had continued headache and nausea.  No vomiting.  He does not take any blood thinning medications.  He states that prior to this he felt like his heart was "beating really fast" and he felt like his knees were weak.  He denies any history of DVT/PE, leg swelling, surgeries/immobilizations, hemoptysis, chest pain, or shortness of breath.    He chose not to get vaccinated against covid.  He reports that due to joint aches he has been not sleeping well.    He has been taking Tylenol Cold and flu.  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      HPI     History reviewed. No pertinent past medical history.  Patient Active Problem List   Diagnosis Date Noted  . Hypertension 01/28/2020  . Orthostatic lightheadedness 01/28/2020  . COVID-19 01/28/2020    History reviewed. No pertinent surgical history.     No family history on file.  Social History   Tobacco Use  . Smoking status: Current Some Day Smoker    Types: Cigars  . Smokeless tobacco: Never Used   Substance Use Topics  . Alcohol use: Yes    Comment: socially   . Drug use: No    Home Medications Prior to Admission medications   Medication Sig Start Date End Date Taking? Authorizing Provider  acetaminophen (TYLENOL) 325 MG tablet Take 325 mg by mouth daily.   Yes [provider]  Garlic (GARLIQUE PO) Take 1 tablet by mouth daily.   Yes [provider]  naproxen sodium (ALEVE) 220 MG tablet Take 220 mg by mouth daily as needed (pain).   Yes [provider]  fluticasone (FLONASE) 50 MCG/ACT nasal spray Place 1-2 sprays into both nostrils daily. Patient not taking: Reported on 01/28/2020 10/17/19   Wieters, Hallie C, PA-C  ibuprofen (ADVIL) 800 MG tablet Take 1 tablet (800 mg total) by mouth 3 (three) times daily. Patient not taking: Reported on 01/28/2020 10/17/19   Wieters, Hallie C, PA-C  methocarbamol (ROBAXIN) 500 MG tablet Take 1 tablet (500 mg total) by mouth 2 (two) times daily. Patient not taking: Reported on 01/28/2020 12/27/15   de Villier, Daryl F II, PA  omeprazole (PRILOSEC) 20 MG capsule Take 1 capsule (20 mg total) by mouth 2 (two) times daily before a meal. Patient not taking: Reported on 01/28/2020 07/30/15   Linwood Dibbles, MD    Allergies    Patient has no known allergies.  Review of Systems   Review of Systems  Constitutional: Positive for chills, fatigue and fever.  Eyes: Negative for pain, redness and visual disturbance.  Respiratory: Positive for cough. Negative for shortness of breath.   Cardiovascular: Positive for palpitations. Negative for leg swelling.  Gastrointestinal: Positive for nausea. Negative for abdominal pain, constipation, diarrhea and vomiting.  Genitourinary: Negative for dysuria.  Musculoskeletal: Positive for arthralgias, myalgias and neck pain.  Skin: Positive for wound. Negative for color change.  Neurological: Positive for syncope, weakness, light-headedness and headaches.  All other systems reviewed and are  negative.   Physical Exam Updated Vital Signs BP 140/77   Pulse 70   Temp 100.1 F (37.8 C)   Resp 19   Ht 5\' 7"  (1.702 m)   Wt 85.3 kg   SpO2 100%   BMI 29.45 kg/m   Physical Exam Vitals and nursing note reviewed.  Constitutional:      General: He is not in acute distress.    Appearance: He is not ill-appearing.  HENT:     Head:     Comments: No raccoon's eyes or battle signs bilaterally.  There is a subcentimeter laceration on the right lateral eyelid without surrounding crepitus or tenderness. Eyes:     Extraocular Movements: Extraocular movements intact.     Conjunctiva/sclera: Conjunctivae normal.     Pupils: Pupils are equal, round, and reactive to light.  Neck:     Comments: Upper midline C-spine TTP Cardiovascular:     Rate and Rhythm: Normal rate.     Pulses: Normal pulses.  Pulmonary:     Effort: Pulmonary effort is normal. No respiratory distress.  Musculoskeletal:     Cervical back: Normal range of motion and neck supple.     Right lower leg: No edema.     Left lower leg: No edema.  Skin:    General: Skin is warm.  Neurological:     Mental Status: He is alert.     Comments: Awake and alert, answers all questions appropriately.  Speech is not slurred. 5/5 strength bilateral upper and lower extremities.    Psychiatric:        Mood and Affect: Mood normal.        Behavior: Behavior normal.     ED Results / Procedures / Treatments   Labs (all labs ordered are listed, but only abnormal results are displayed) Labs Reviewed  COMPREHENSIVE METABOLIC PANEL - Abnormal; Notable for the following components:      Result Value   Alkaline Phosphatase 36 (*)    All other components within normal limits  CBC WITH DIFFERENTIAL/PLATELET - Abnormal; Notable for the following components:   WBC 3.4 (*)    All other components within normal limits    EKG EKG Interpretation  Date/Time:  Sunday January 28 2020 20:53:39 EST Ventricular Rate:  77 PR Interval:     QRS Duration: 92 QT Interval:  342 QTC Calculation: 387 R Axis:   71 Text Interpretation: Sinus rhythm ST elev, probable normal early repol pattern similar to June 2017 Confirmed by July 2017 [phone]) on 01/28/2020 8:56:28 PM   Radiology CT Head Wo Contrast  Result Date: 01/28/2020 CLINICAL DATA:  Fall, hit head 2 days ago.  Headache. EXAM: CT HEAD WITHOUT CONTRAST TECHNIQUE: Contiguous axial images were obtained from the base of the skull through the vertex without intravenous contrast. COMPARISON:  None. FINDINGS: Brain: No acute intracranial abnormality. Specifically, no hemorrhage, hydrocephalus, mass lesion, acute infarction, or significant intracranial injury. Vascular:  No hyperdense vessel or unexpected calcification. Skull: No acute calvarial abnormality. Sinuses/Orbits: Visualized paranasal sinuses and mastoids clear. Orbital soft tissues unremarkable. Other: None IMPRESSION: Normal study. Electronically Signed   By: Charlett Nose M.D.   On: 01/28/2020 20:16   CT Cervical Spine Wo Contrast  Result Date: 01/28/2020 CLINICAL DATA:  Fall 2 days ago EXAM: CT CERVICAL SPINE WITHOUT CONTRAST TECHNIQUE: Multidetector CT imaging of the cervical spine was performed without intravenous contrast. Multiplanar CT image reconstructions were also generated. COMPARISON:  None. FINDINGS: Alignment: Normal Skull base and vertebrae: No acute fracture. No primary bone lesion or focal pathologic process. Soft tissues and spinal canal: No prevertebral fluid or swelling. No visible canal hematoma. Disc levels:  Normal Upper chest: Negative Other: None IMPRESSION: Normal study. Electronically Signed   By: Charlett Nose M.D.   On: 01/28/2020 20:16   DG Chest Port 1 View  Result Date: 01/28/2020 CLINICAL DATA:  Covid positive EXAM: PORTABLE CHEST 1 VIEW COMPARISON:  None. FINDINGS: The cardiomediastinal contours are within normal limits. The lungs are clear. No pneumothorax or pleural effusion. No acute  finding in the visualized skeleton. IMPRESSION: No acute cardiopulmonary finding. Electronically Signed   By: Emmaline Kluver M.D.   On: 01/28/2020 19:12    Procedures Procedures (including critical care time) Orthostatic VS for the past 24 hrs:  BP- Lying Pulse- Lying BP- Sitting Pulse- Sitting BP- Standing at 0 minutes Pulse- Standing at 0 minutes  01/28/20 2054 (!) 146/92 74 150/84 79 144/83 92      Medications Ordered in ED Medications  lactated ringers bolus 1,000 mL (0 mLs Intravenous Stopped 01/28/20 2336)    ED Course  I have reviewed the triage vital signs and the nursing notes.  Pertinent labs & imaging results that were available during my care of the patient were reviewed by me and considered in my medical decision making (see chart for details).    MDM Rules/Calculators/A&P                         Jesus Wong was evaluated in Emergency Department on 01/29/2020 for the symptoms described in the history of present illness. He was evaluated in the context of the global COVID-19 pandemic, which necessitated consideration that the patient might be at risk for infection with the SARS-CoV-2 virus that causes COVID-19. Institutional protocols and algorithms that pertain to the evaluation of patients at risk for COVID-19 are in a state of rapid change based on information released by regulatory bodies including the CDC and federal and state organizations. These policies and algorithms were followed during the patient's care in the ED.  Patient is a 29 year old man who is on day 3 of COVID-19 symptoms after having 2+ test at home and choosing not to get vaccinated. Here he is afebrile, not tachycardic or tachypneic and 100% on room air.  He is hypertensive.  He does report that yesterday he had a syncopal event.  I suspect that this is a primarily vasovagal mediated event as prior to it he had sat in a hot bath for half an hour, and then stood up in a hot shower.  Here his blood  pressure did not change significantly with orthostatics and he was hypertensive however his heart rate did jump from 70s up to 107.  EKG without ischemia.  Chest x-ray is unremarkable.  While he did pass out and struck his head with headache and neck pain CT head and neck are  obtained without evidence of fracture, intracranial hemorrhage or other acute abnormality.  I suspect his headache is a combination of COVID-19 combined with concussion.  Labs are obtained and reviewed, he has had mild leukopenia consistent with Covid however no other significant abnormalities or cause for his symptoms.  He received 1 L IV fluid bolus.  At this point patient is awaiting repeat orthostatic vital signs.  If these are improved anticipate patient will be discharged by Roxy Horsemanobert Browning, PA-C.  Patient is aware of the plan, I personally reviewed all discharge instructions and return precautions with him and he stated his understanding.  Remission sent to monoclonal antibody infusion clinic as patient qualifies based on his hypertension and elevated BMI.  Return precautions were discussed with patient who states their understanding.  At the time of discharge patient denied any unaddressed complaints or concerns.  Patient is agreeable for discharge home.  Note: Portions of this report may have been transcribed using voice recognition software. Every effort was made to ensure accuracy; however, inadvertent computerized transcription errors may be present   Final Clinical Impression(s) / ED Diagnoses Final diagnoses:  COVID-19  Hypertension, unspecified type  Orthostatic lightheadedness    Rx / DC Orders ED Discharge Orders    None       Cristina GongHammond, Cire Clute W, PA-C 01/29/20 0019    Pricilla LovelessGoldston, Scott, MD 01/29/20 [phone]

## 2020-01-28 NOTE — Discharge Instructions (Addendum)
As we discussed many cough and cold medications can increase your blood pressure if they contain any decongestants.  If you wish for cough/cold medication please take a medicine such as Coricidin HBP medication specific for people with high blood pressure.  Please take Tylenol (acetaminophen) to relieve your pain.  You may take tylenol, up to 1,000 mg (two extra strength pills).  Do not take more than 3,000 mg tylenol in a 24 hour period.  Please check all medication labels as many medications such as pain and cold medications may contain tylenol. Please do not drink alcohol while taking this medication.   I have given you information on the DASH eating plan.  Please start this once you recover from covid to help control your blood pressure.

## 2020-01-30 ENCOUNTER — Other Ambulatory Visit: Payer: Self-pay | Admitting: Nurse Practitioner

## 2020-01-30 ENCOUNTER — Encounter: Payer: Self-pay | Admitting: Nurse Practitioner

## 2020-01-30 DIAGNOSIS — E663 Overweight: Secondary | ICD-10-CM | POA: Insufficient documentation

## 2020-01-30 DIAGNOSIS — I10 Essential (primary) hypertension: Secondary | ICD-10-CM | POA: Insufficient documentation

## 2020-01-30 DIAGNOSIS — U071 COVID-19: Secondary | ICD-10-CM

## 2020-01-30 NOTE — Progress Notes (Signed)
I connected by phone with Jesus Wong on 01/30/2020 at 2:25 PM to discuss the potential use of a new treatment for mild to moderate COVID-19 viral infection in non-hospitalized patients.  This patient is a 29 y.o. male that meets the FDA criteria for Emergency Use Authorization of COVID monoclonal antibody casirivimab/imdevimab, bamlanivimab/etesevimab, or sotrovimab.  Has a (+) direct SARS-CoV-2 viral test result  Has mild or moderate COVID-19   Is NOT hospitalized due to COVID-19  Is within 10 days of symptom onset  Has at least one of the high risk factor(s) for progression to severe COVID-19 and/or hospitalization as defined in EUA.  Specific high risk criteria : BMI > 25 and Cardiovascular disease or hypertension   I have spoken and communicated the following to the patient or parent/caregiver regarding COVID monoclonal antibody treatment:  1. FDA has authorized the emergency use for the treatment of mild to moderate COVID-19 in adults and pediatric patients with positive results of direct SARS-CoV-2 viral testing who are 13 years of age and older weighing at least 40 kg, and who are at high risk for progressing to severe COVID-19 and/or hospitalization.  2. The significant known and potential risks and benefits of COVID monoclonal antibody, and the extent to which such potential risks and benefits are unknown.  3. Information on available alternative treatments and the risks and benefits of those alternatives, including clinical trials.  4. Patients treated with COVID monoclonal antibody should continue to self-isolate and use infection control measures (e.g., wear mask, isolate, social distance, avoid sharing personal items, clean and disinfect "high touch" surfaces, and frequent handwashing) according to CDC guidelines.   5. The patient or parent/caregiver has the option to accept or refuse COVID monoclonal antibody treatment.  After reviewing this information with the patient,  the patient has agreed to receive one of the available covid 19 monoclonal antibodies and will be provided an appropriate fact sheet prior to infusion. Nicolasa Ducking, NP 01/30/2020 2:25 PM

## 2020-01-31 ENCOUNTER — Ambulatory Visit (HOSPITAL_COMMUNITY)
Admission: RE | Admit: 2020-01-31 | Discharge: 2020-01-31 | Disposition: A | Discharge status: Home / Self Care | Payer: HRSA Program | Source: Ambulatory Visit | Attending: Pulmonary Disease | Admitting: Pulmonary Disease

## 2020-01-31 DIAGNOSIS — I10 Essential (primary) hypertension: Secondary | ICD-10-CM | POA: Diagnosis present

## 2020-01-31 DIAGNOSIS — E663 Overweight: Secondary | ICD-10-CM | POA: Insufficient documentation

## 2020-01-31 DIAGNOSIS — U071 COVID-19: Secondary | ICD-10-CM | POA: Insufficient documentation

## 2020-01-31 MED ORDER — METHYLPREDNISOLONE SODIUM SUCC 125 MG IJ SOLR: 125.0000 mg | Freq: Once | INTRAMUSCULAR | Status: DC | PRN

## 2020-01-31 MED ORDER — SODIUM CHLORIDE 0.9 % IV SOLN: Freq: Once | INTRAVENOUS | Status: AC

## 2020-01-31 MED ORDER — DIPHENHYDRAMINE HCL 50 MG/ML IJ SOLN: 50.0000 mg | Freq: Once | INTRAMUSCULAR | Status: DC | PRN

## 2020-01-31 MED ORDER — FAMOTIDINE IN NACL 20-0.9 MG/50ML-% IV SOLN: 20.0000 mg | Freq: Once | INTRAVENOUS | Status: DC | PRN

## 2020-01-31 MED ORDER — SODIUM CHLORIDE 0.9 % IV SOLN: INTRAVENOUS | Status: DC | PRN

## 2020-01-31 MED ORDER — ACETAMINOPHEN 325 MG PO TABS
650.0000 mg | ORAL_TABLET | Freq: Once | ORAL | Status: AC
  Administered 2020-01-31: 11:00:00 650 mg via ORAL
  Filled 2020-01-31: qty 2

## 2020-01-31 MED ORDER — ALBUTEROL SULFATE HFA 108 (90 BASE) MCG/ACT IN AERS: 2.0000 | INHALATION_SPRAY | Freq: Once | RESPIRATORY_TRACT | Status: DC | PRN

## 2020-01-31 MED ORDER — EPINEPHRINE 0.3 MG/0.3ML IJ SOAJ: 0.3000 mg | Freq: Once | INTRAMUSCULAR | Status: DC | PRN

## 2020-01-31 NOTE — Progress Notes (Signed)
Patient reviewed Fact Sheet for Patients, Parents, and Caregivers for Emergency Use Authorization (EUA) of bamlanivimab and etesevimab for the Treatment of Coronavirus. Patient also reviewed and is agreeable to the estimated cost of treatment. Patient is agreeable to proceed.   

## 2020-01-31 NOTE — Discharge Instructions (Signed)
10 Things You Can Do to Manage Your COVID-19 Symptoms at Home If you have possible or confirmed COVID-19: 1. Stay home from work and school. And stay away from other public places. If you must go out, avoid using any kind of public transportation, ridesharing, or taxis. 2. Monitor your symptoms carefully. If your symptoms get worse, call your healthcare provider immediately. 3. Get rest and stay hydrated. 4. If you have a medical appointment, call the healthcare provider ahead of time and tell them that you have or may have COVID-19. 5. For medical emergencies, call 911 and notify the dispatch personnel that you have or may have COVID-19. 6. Cover your cough and sneezes with a tissue or use the inside of your elbow. 7. Wash your hands often with soap and water for at least 20 seconds or clean your hands with an alcohol-based hand sanitizer that contains at least 60% alcohol. 8. As much as possible, stay in a specific room and away from other people in your home. Also, you should use a separate bathroom, if available. If you need to be around other people in or outside of the home, wear a mask. 9. Avoid sharing personal items with other people in your household, like dishes, towels, and bedding. 10. Clean all surfaces that are touched often, like counters, tabletops, and doorknobs. Use household cleaning sprays or wipes according to the label instructions. cdc.gov/coronavirus 08/17/2018 This information is not intended to replace advice given to you by your health care provider. Make sure you discuss any questions you have with your health care provider. Document Revised: 01/19/2019 Document Reviewed: 01/19/2019 Elsevier Patient Education  2020 [employer] What types of side effects do monoclonal antibody drugs cause?  Common side effects  In general, the more common side effects caused by monoclonal antibody drugs include: . Allergic reactions, such as hives or itching . Flu-like signs and  symptoms, including chills, fatigue, fever, and muscle aches and pains . Nausea, vomiting . Diarrhea . Skin rashes . Low blood pressure   The CDC is recommending patients who receive monoclonal antibody treatments wait at least 90 days before being vaccinated.  Currently, there are no data on the safety and efficacy of mRNA COVID-19 vaccines in persons who received monoclonal antibodies or convalescent plasma as part of COVID-19 treatment. Based on the estimated half-life of such therapies as well as evidence suggesting that reinfection is uncommon in the 90 days after initial infection, vaccination should be deferred for at least 90 days, as a precautionary measure until additional information becomes available, to avoid interference of the antibody treatment with vaccine-induced immune responses. If you have any questions or concerns after the infusion please call the Advanced Practice Provider on call at [phone]. This number is ONLY intended for your use regarding questions or concerns about the infusion post-treatment side-effects.  Please do not provide this number to others for use. For return to work notes please contact your primary care provider.   If someone you know is interested in receiving treatment please have them call the COVID hotline at [phone].   

## 2020-01-31 NOTE — Progress Notes (Signed)
  Diagnosis: COVID-19  Physician: Dr. Wright  Procedure: [facility] Med: bamlanivimab\etesevimab infusion - Provided patient with bamlanimivab\etesevimab fact sheet for patients, parents and caregivers prior to infusion.  Complications: No immediate complications noted.  Discharge: Discharged home   Ephrem Carrick J Olena Willy 01/31/2020  

## 2020-09-06 ENCOUNTER — Other Ambulatory Visit: Payer: Self-pay | Admitting: Nurse Practitioner

## 2020-09-06 ENCOUNTER — Other Ambulatory Visit: Payer: Self-pay

## 2020-09-06 ENCOUNTER — Ambulatory Visit
Admission: RE | Admit: 2020-09-06 | Discharge: 2020-09-06 | Disposition: A | Discharge status: Home / Self Care | Payer: No Typology Code available for payment source | Source: Ambulatory Visit | Attending: Nurse Practitioner | Admitting: Nurse Practitioner

## 2020-09-06 DIAGNOSIS — Z021 Encounter for pre-employment examination: Secondary | ICD-10-CM

## 2021-06-12 IMAGING — CT CT HEAD W/O CM
4 of 5 series · 16 of 47 positions shown, 17 images · non-contrast
Comparison: None.

CLINICAL DATA: Fall, hit head 2 days ago.  Headache.

EXAM:
CT HEAD WITHOUT CONTRAST
TECHNIQUE: Contiguous axial images were obtained from the base of the skull
through the vertex without intravenous contrast.

[Series 3: head bone · axial · 0.47mm/px · z∈[-103,+9]mm · 7 of 85 slices shown]
[im 8/85  bone]
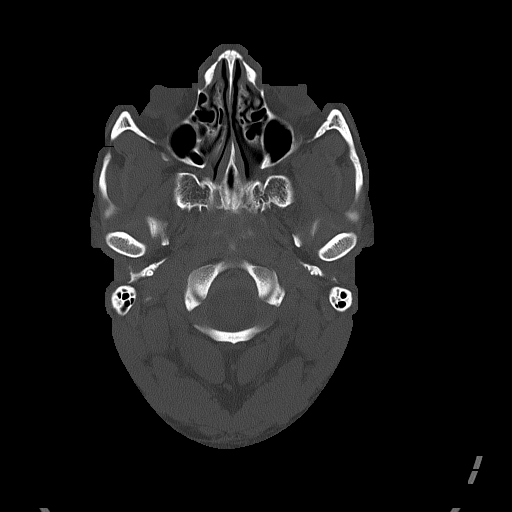
[im 22/85  bone]
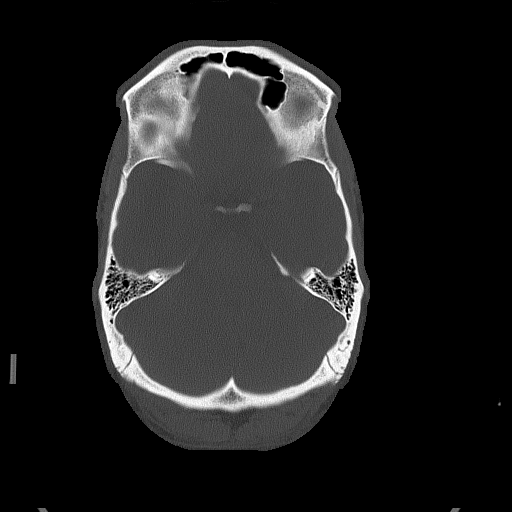
[im 29/85  bone]
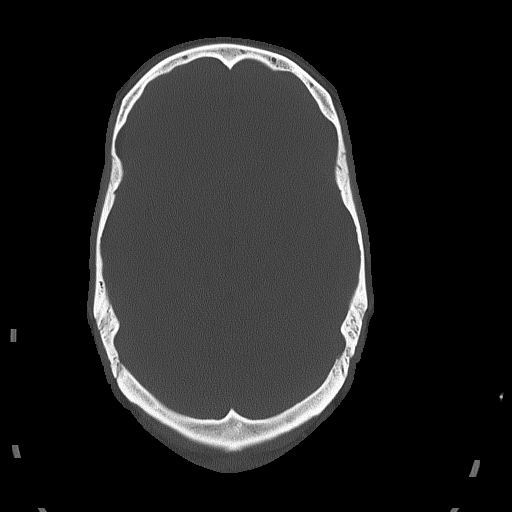
[im 36/85  bone]
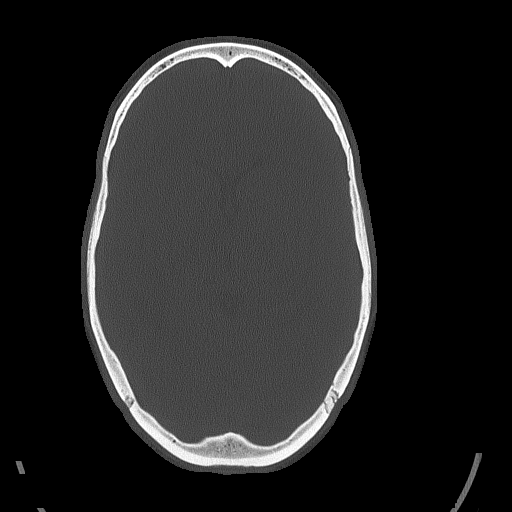
[im 50/85  bone]
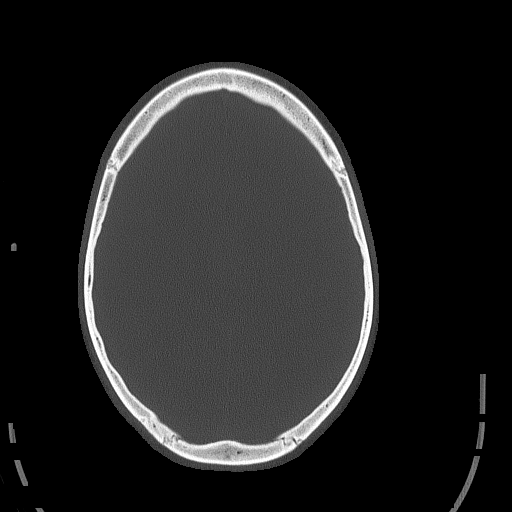
[im 57/85  bone]
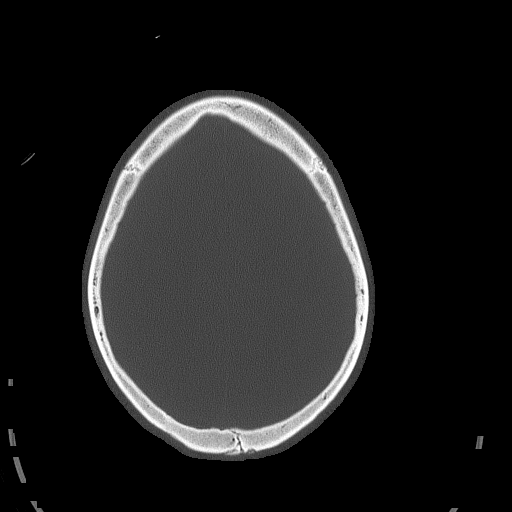
[im 64/85  bone]
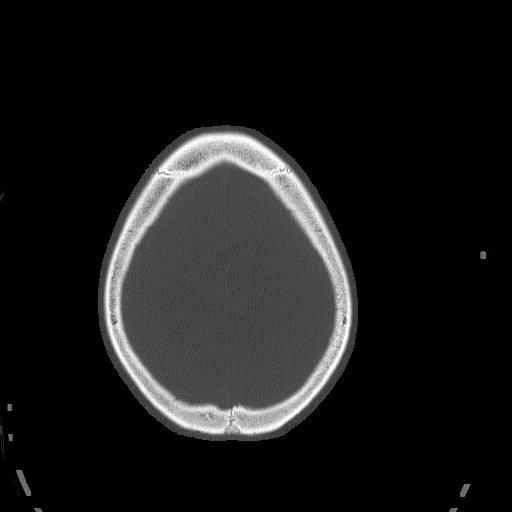

[Series 4: head without · axial · non-contrast · 0.47mm/px · z∈[-77,+3]mm · 3 of 34 slices shown, 4 images]
[im 9/34  brain]
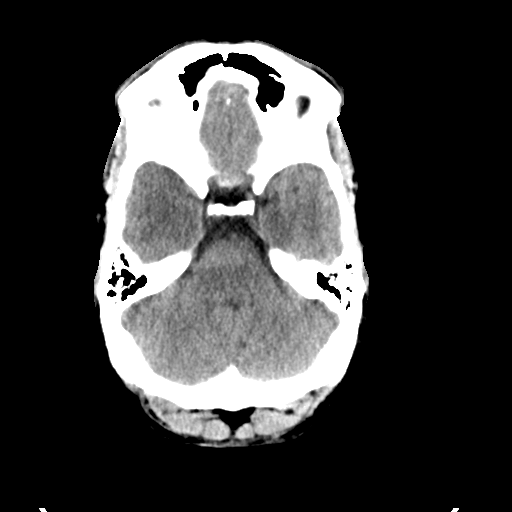
[im 9/34  bone]
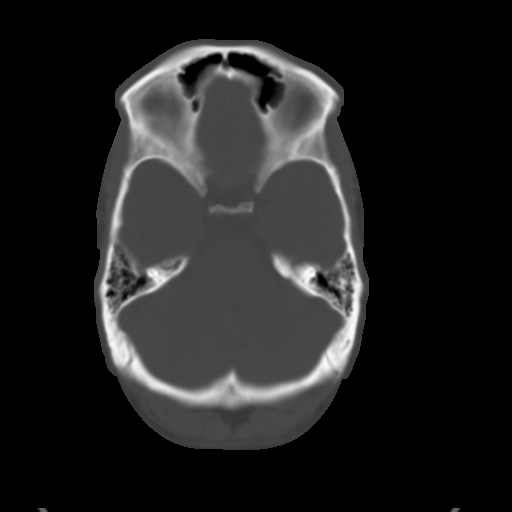
[im 17/34  brain]
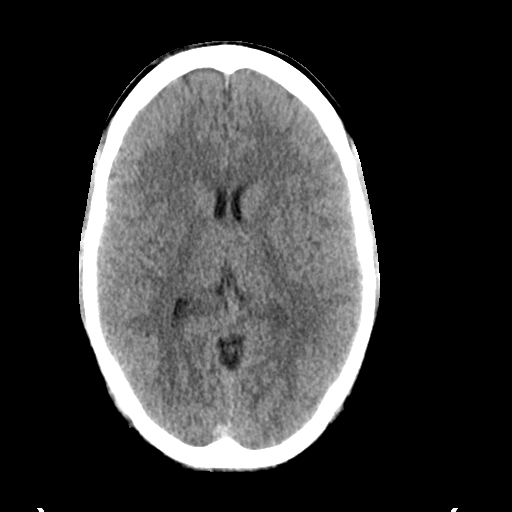
[im 25/34  brain]
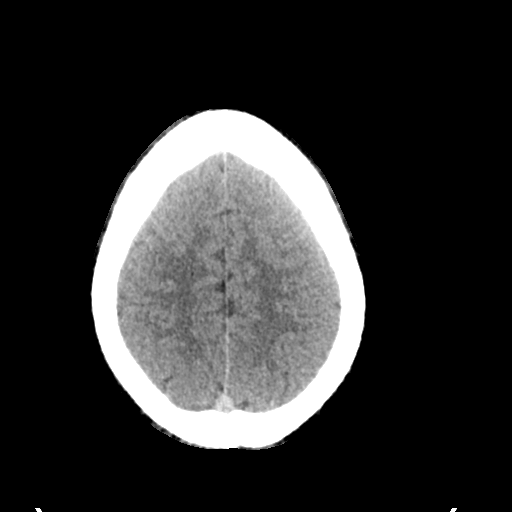

[Series 9: head without cor · coronal · non-contrast · 0.33mm/px · 3 of 75 slices shown]
[im 25/75  brain]
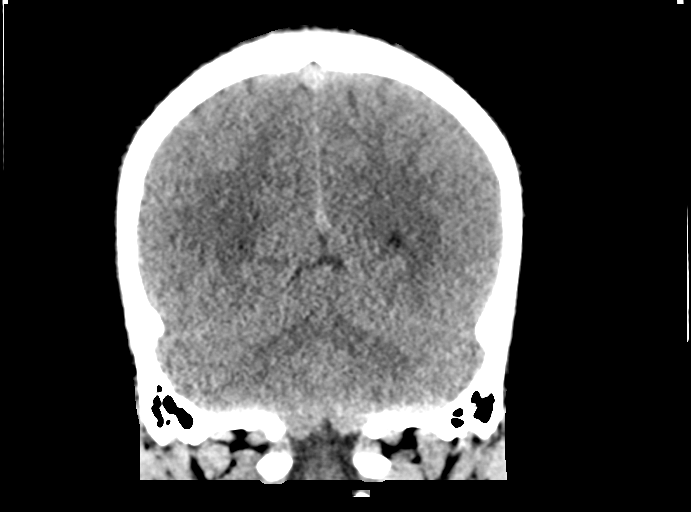
[im 33/75  brain]
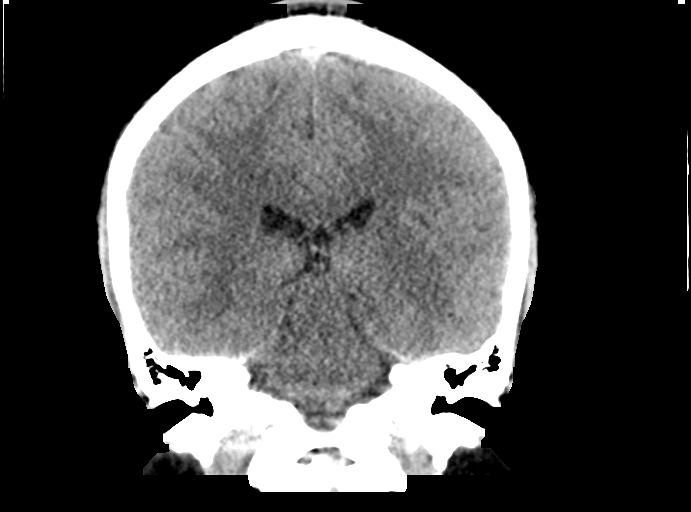
[im 42/75  brain]
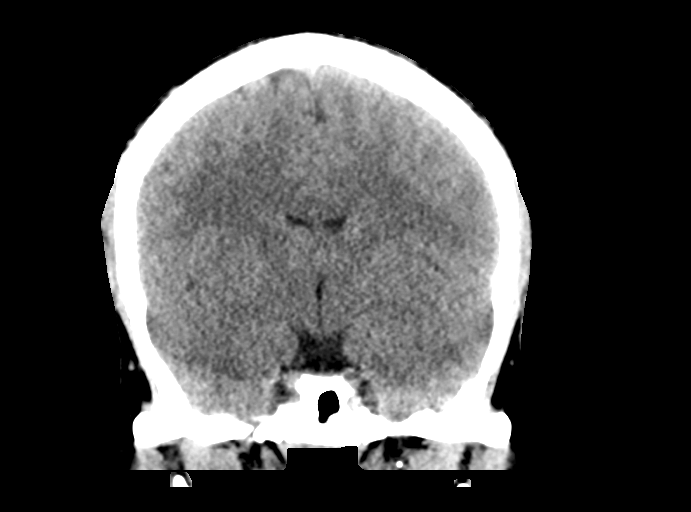

[Series 10: head without sag · sagittal · non-contrast · 0.33mm/px · 3 of 62 slices shown]
[im 21/62  brain]
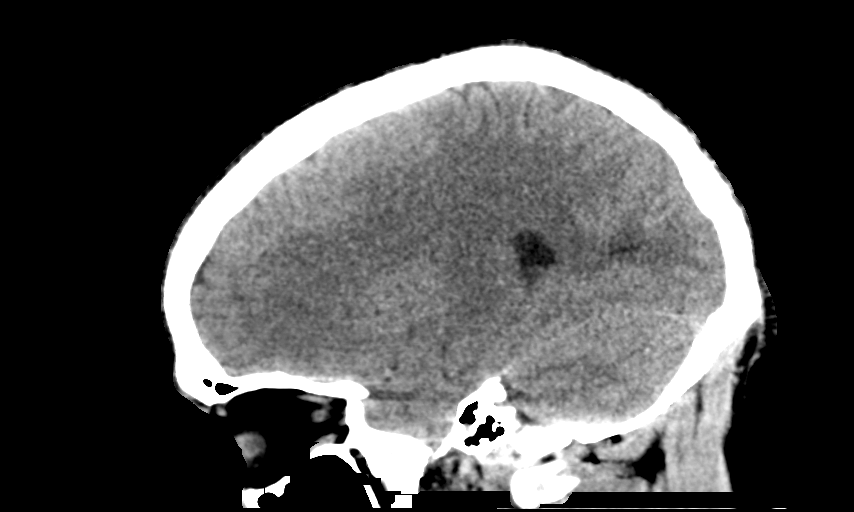
[im 31/62  brain]
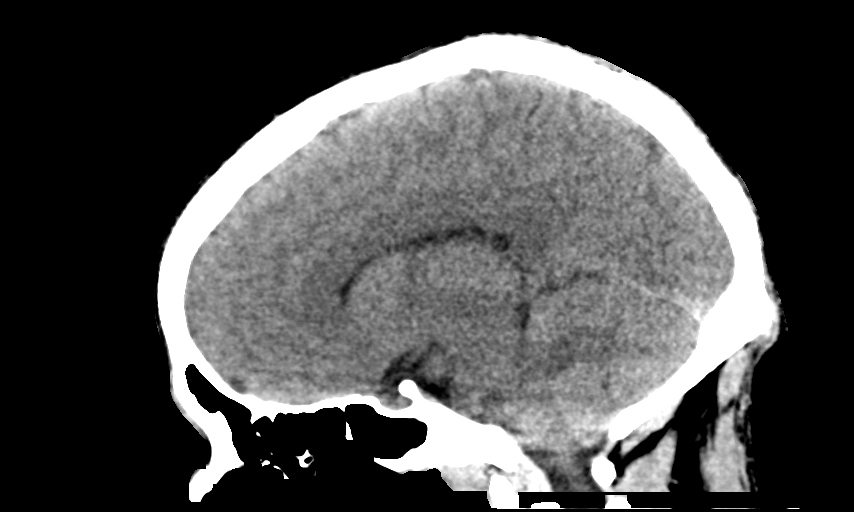
[im 41/62  brain]
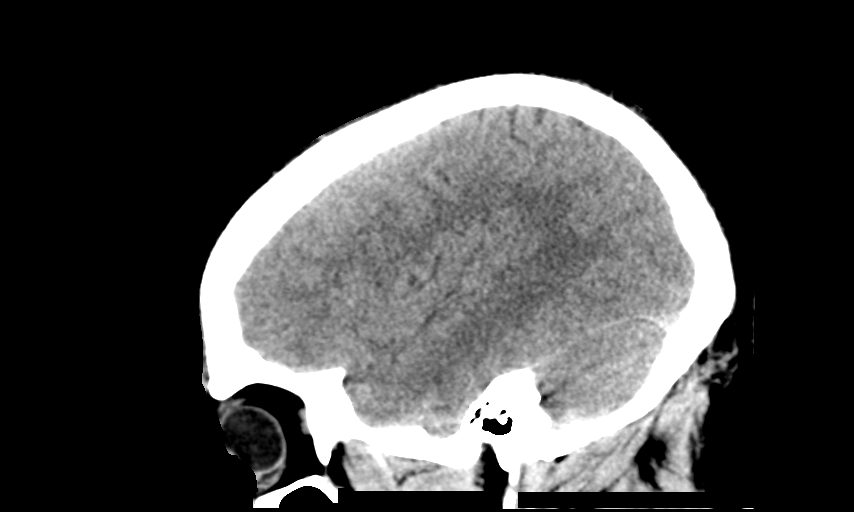

[16 of 47 positions shown; findings below may reference images not displayed]

FINDINGS: Brain: No acute intracranial abnormality. Specifically, no
hemorrhage, hydrocephalus, mass lesion, acute infarction, or
significant intracranial injury.

Vascular: No hyperdense vessel or unexpected calcification.

Skull: No acute calvarial abnormality.

Sinuses/Orbits: Visualized paranasal sinuses and mastoids clear.
Orbital soft tissues unremarkable.

Other: None
IMPRESSION: Normal study.

## 2022-01-20 IMAGING — CR DG CHEST 1V
1 series · 1 of 1 positions shown · non-contrast
Comparison: January 28, 2020.

CLINICAL DATA: Pre-employment exam.

EXAM:
CHEST  1 VIEW

[w chest pa]
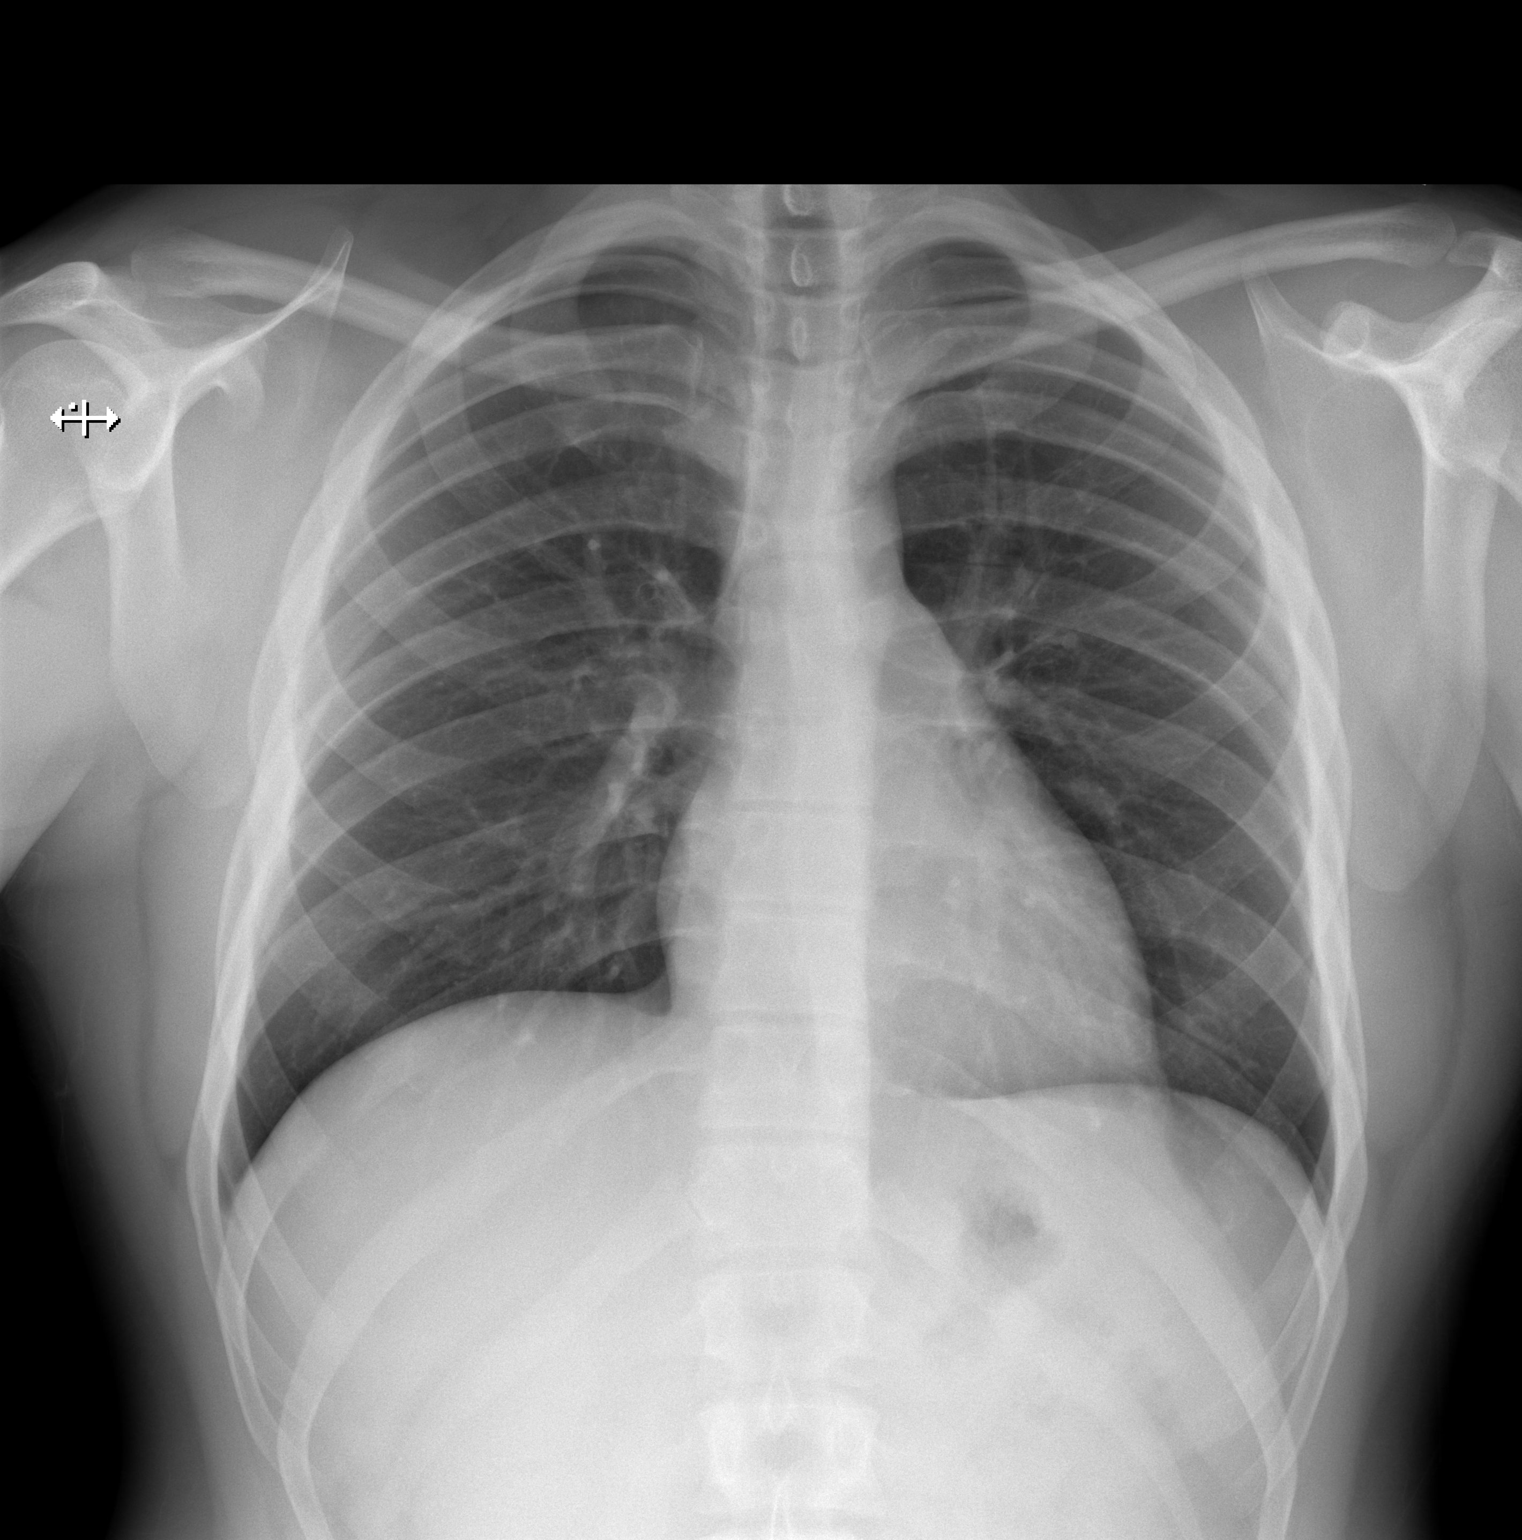

[1 of 1 positions shown; findings below may reference images not displayed]

FINDINGS: The heart size and mediastinal contours are within normal limits.
Both lungs are clear. The visualized skeletal structures are
unremarkable.
IMPRESSION: No active disease.
